# Patient Record
Sex: Female | Born: 1990 | Race: White | Hispanic: Yes | Marital: Married | Smoking: Never smoker
Health system: Southern US, Community
[De-identification: ages and names within clinical notes are randomized; demographics above are authoritative.]

## PROBLEM LIST (undated history)

## (undated) ENCOUNTER — Inpatient Hospital Stay (HOSPITAL_COMMUNITY): Payer: Self-pay

## (undated) DIAGNOSIS — R51 Headache: Secondary | ICD-10-CM

## (undated) DIAGNOSIS — Z8751 Personal history of pre-term labor: Secondary | ICD-10-CM

## (undated) DIAGNOSIS — R519 Headache, unspecified: Secondary | ICD-10-CM

## (undated) DIAGNOSIS — E282 Polycystic ovarian syndrome: Secondary | ICD-10-CM

## (undated) HISTORY — PX: BACK SURGERY: SHX140

## (undated) HISTORY — DX: Headache, unspecified: R51.9

## (undated) HISTORY — PX: OTHER SURGICAL HISTORY: SHX169

## (undated) HISTORY — DX: Polycystic ovarian syndrome: E28.2

## (undated) HISTORY — DX: Headache: R51

## (undated) HISTORY — PX: WISDOM TOOTH EXTRACTION: SHX21

---
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
## 2011-08-05 ENCOUNTER — Emergency Department (HOSPITAL_COMMUNITY)
Admission: EM | Admit: 2011-08-05 | Discharge: 2011-08-05 | Disposition: A | Discharge status: Home / Self Care | Payer: Self-pay | Attending: Emergency Medicine | Admitting: Emergency Medicine

## 2011-08-05 ENCOUNTER — Encounter (HOSPITAL_COMMUNITY): Payer: Self-pay | Admitting: Emergency Medicine

## 2011-08-05 ENCOUNTER — Emergency Department (HOSPITAL_COMMUNITY): Payer: Self-pay

## 2011-08-05 DIAGNOSIS — R110 Nausea: Secondary | ICD-10-CM | POA: Insufficient documentation

## 2011-08-05 DIAGNOSIS — R109 Unspecified abdominal pain: Secondary | ICD-10-CM | POA: Insufficient documentation

## 2011-08-05 DIAGNOSIS — R10819 Abdominal tenderness, unspecified site: Secondary | ICD-10-CM | POA: Insufficient documentation

## 2011-08-05 LAB — CBC
MCH: 28.6 pg (ref 26.0–34.0)
MCHC: 33 g/dL (ref 30.0–36.0)
MCV: 86.7 fL (ref 78.0–100.0)
Platelets: 232 10*3/uL (ref 150–400)

## 2011-08-05 LAB — COMPREHENSIVE METABOLIC PANEL
Albumin: 4.2 g/dL (ref 3.5–5.2)
Alkaline Phosphatase: 118 U/L — ABNORMAL HIGH (ref 39–117)
BUN: 12 mg/dL (ref 6–23)
Calcium: 9.3 mg/dL (ref 8.4–10.5)
GFR calc Af Amer: >90 mL/min (ref >90)
Glucose, Bld: 96 mg/dL (ref 70–99)
Potassium: 4 mEq/L (ref 3.5–5.1)
Sodium: 139 mEq/L (ref 135–145)
Total Protein: 8.4 g/dL — ABNORMAL HIGH (ref 6.0–8.3)

## 2011-08-05 LAB — URINALYSIS, ROUTINE W REFLEX MICROSCOPIC
Glucose, UA: NEGATIVE mg/dL
Ketones, ur: NEGATIVE mg/dL
Nitrite: NEGATIVE
Specific Gravity, Urine: 1.024 (ref 1.005–1.030)
pH: 5.5 (ref 5.0–8.0)

## 2011-08-05 LAB — POCT I-STAT, CHEM 8
Chloride: 105 mEq/L (ref 96–112)
HCT: 43 % (ref 36.0–46.0)
Hemoglobin: 14.6 g/dL (ref 12.0–15.0)
Potassium: 4 mEq/L (ref 3.5–5.1)
Sodium: 141 mEq/L (ref 135–145)

## 2011-08-05 LAB — URINE MICROSCOPIC-ADD ON

## 2011-08-05 LAB — DIFFERENTIAL
Basophils Relative: 0 % (ref 0–1)
Eosinophils Absolute: 0.1 10*3/uL (ref 0.0–0.7)
Eosinophils Relative: 1 % (ref 0–5)
Monocytes Relative: 6 % (ref 3–12)
Neutrophils Relative %: 77 % (ref 43–77)

## 2011-08-05 LAB — PREGNANCY, URINE: Preg Test, Ur: NEGATIVE

## 2011-08-05 MED ORDER — PANTOPRAZOLE SODIUM 20 MG PO TBEC: 20.0000 mg | DELAYED_RELEASE_TABLET | Freq: Every day | ORAL | Status: DC

## 2011-08-05 MED ORDER — IBUPROFEN 400 MG PO TABS
600.0000 mg | ORAL_TABLET | Freq: Once | ORAL | Status: AC
  Administered 2011-08-05: 600 mg via ORAL
  Filled 2011-08-05: qty 3

## 2011-08-05 MED ORDER — ONDANSETRON HCL 4 MG PO TABS: 4.0000 mg | ORAL_TABLET | Freq: Four times a day (QID) | ORAL | Status: AC

## 2011-08-05 NOTE — ED Notes (Signed)
Pt. Stated, I have  A sharp pain in my stomach this am, no other symptoms

## 2011-08-05 NOTE — ED Provider Notes (Signed)
History     CSN: [id number]  Arrival date & time 08/05/11  1150   First MD Initiated Contact with Patient 08/05/11 1435      Chief Complaint  Patient presents with  . Abdominal Pain    (Consider location/radiation/quality/duration/timing/severity/associated sxs/prior treatment) Patient is a 21 y.o. female presenting with abdominal pain. The history is provided by the patient.  Abdominal Pain The primary symptoms of the illness include abdominal pain and nausea. The primary symptoms of the illness do not include shortness of breath, vomiting or diarrhea.  Symptoms associated with the illness do not include back pain.   patient has had upper abdominal pain since earlier today. She's not eaten. No fevers. She's had some nausea without vomiting. No diarrhea. Constipation. No dysuria. The pain is constant. She states she's not had pains like this before. She states she has previously lower abdominal pains, but she had an ultrasound for and was told it was nothing. She did not eat breakfast today.  History reviewed. No pertinent past medical history.  History reviewed. No pertinent past surgical history.  No family history on file.  History  Substance Use Topics  . Smoking status: Not on file  . Smokeless tobacco: Not on file  . Alcohol Use: No    OB History    Grav Para Term Preterm Abortions TAB SAB Ect Mult Living                  Review of Systems  Constitutional: Negative for activity change and appetite change.  HENT: Negative for neck stiffness.   Eyes: Negative for pain.  Respiratory: Negative for chest tightness and shortness of breath.   Cardiovascular: Negative for chest pain and leg swelling.  Gastrointestinal: Positive for nausea and abdominal pain. Negative for vomiting and diarrhea.  Genitourinary: Negative for flank pain.  Musculoskeletal: Negative for back pain.  Skin: Negative for rash.  Neurological: Negative for weakness, numbness and headaches.    Psychiatric/Behavioral: Negative for behavioral problems.    Allergies  Review of patient's allergies indicates no known allergies.  Home Medications   Current Outpatient Rx  Name Route Sig Dispense Refill  . ONDANSETRON HCL 4 MG PO TABS Oral Take 1 tablet (4 mg total) by mouth every 6 (six) hours. 12 tablet 0  . PANTOPRAZOLE SODIUM 20 MG PO TBEC Oral Take 1 tablet (20 mg total) by mouth daily. 14 tablet 0    BP 123/69  Pulse 99  Temp 100.5 F (38.1 C) (Oral)  Resp 18  SpO2 97%  Physical Exam  Nursing note and vitals reviewed. Constitutional: She is oriented to person, place, and time. She appears well-developed and well-nourished.  HENT:  Head: Normocephalic and atraumatic.  Neck: Normal range of motion. Neck supple.  Cardiovascular: Normal rate, regular rhythm and normal heart sounds.   No murmur heard. Pulmonary/Chest: Effort normal and breath sounds normal. No respiratory distress. She has no wheezes. She has no rales.  Abdominal: Soft. Bowel sounds are normal. She exhibits no distension. There is tenderness. There is no rebound and no guarding.       Patient is somewhat obese. Right upper quadrant to epigastric tenderness without rebound or guarding.  Genitourinary:       No CVA tenderness  Musculoskeletal: Normal range of motion.  Neurological: She is alert and oriented to person, place, and time. No cranial nerve deficit.  Skin: Skin is warm and dry.  Psychiatric: She has a normal mood and affect. Her speech is normal.  ED Course  Procedures (including critical care time)  Labs Reviewed  CBC - Abnormal; Notable for the following:    WBC 16.5 (*)     All other components within normal limits  DIFFERENTIAL - Abnormal; Notable for the following:    Neutro Abs 12.7 (*)     All other components within normal limits  COMPREHENSIVE METABOLIC PANEL - Abnormal; Notable for the following:    Total Protein 8.4 (*)     Alkaline Phosphatase 118 (*)     All other  components within normal limits  URINALYSIS, ROUTINE W REFLEX MICROSCOPIC - Abnormal; Notable for the following:    Leukocytes, UA SMALL (*)     All other components within normal limits  URINE MICROSCOPIC-ADD ON - Abnormal; Notable for the following:    Squamous Epithelial / LPF MANY (*)     All other components within normal limits  PREGNANCY, URINE  POCT I-STAT, CHEM 8   US Abdomen Complete  08/05/2011  *RADIOLOGY REPORT*  Clinical Data:  Right upper quadrant pain.  COMPLETE ABDOMINAL ULTRASOUND  Comparison:  None.  Findings:  Gallbladder:  No gallstones, gallbladder wall thickening, or pericholecystic fluid.  Common bile duct:  Measures 0.6 cm.  Liver:  The liver is heterogeneous and difficult to evaluate.  IVC:  Appears normal.  Pancreas:  Limited evaluation.  Spleen:  Measures 8.0 cm in length.  Right Kidney:  Measures 12.9 cm without hydronephrosis.  Left Kidney:  Measures 11.7 cm in length without hydronephrosis.  Abdominal aorta:  No aneurysm identified.  IMPRESSION: Negative abdominal ultrasound.  Normal appearance of the gallbladder.  Liver is heterogeneous and difficult to evaluate.  Original Report Authenticated By: Richarda Overlie, M.D.     1. Abdominal pain       MDM  Patient with upper abdominal pain. Some nauseousness no diarrhea. WBC is mildly elevated. That work is otherwise reassuring. Negative ultrasound. Overall rather benign examination. Lungs are clear. With the elevated white count and a fever that developed at the end of the stay be related to viral infection. I doubt an appendicitis or severe intra-abdominal infection. Patient be discharged home to followup as needed.        Juliet Rude. Rubin Payor, MD 08/05/11 [phone]

## 2011-12-12 ENCOUNTER — Encounter (HOSPITAL_COMMUNITY): Payer: Self-pay | Admitting: *Deleted

## 2011-12-12 ENCOUNTER — Emergency Department (HOSPITAL_COMMUNITY)
Admission: EM | Admit: 2011-12-12 | Discharge: 2011-12-12 | Disposition: A | Discharge status: Home / Self Care | Payer: Self-pay | Attending: Emergency Medicine | Admitting: Emergency Medicine

## 2011-12-12 DIAGNOSIS — H53149 Visual discomfort, unspecified: Secondary | ICD-10-CM | POA: Insufficient documentation

## 2011-12-12 DIAGNOSIS — G43909 Migraine, unspecified, not intractable, without status migrainosus: Secondary | ICD-10-CM | POA: Insufficient documentation

## 2011-12-12 DIAGNOSIS — R112 Nausea with vomiting, unspecified: Secondary | ICD-10-CM | POA: Insufficient documentation

## 2011-12-12 MED ORDER — METOCLOPRAMIDE HCL 5 MG/ML IJ SOLN
10.0000 mg | Freq: Once | INTRAMUSCULAR | Status: AC
  Administered 2011-12-12: 10 mg via INTRAVENOUS
  Filled 2011-12-12: qty 2

## 2011-12-12 MED ORDER — SODIUM CHLORIDE 0.9 % IV BOLUS (SEPSIS)
1000.0000 mL | Freq: Once | INTRAVENOUS | Status: AC
  Administered 2011-12-12: 1000 mL via INTRAVENOUS

## 2011-12-12 MED ORDER — DIPHENHYDRAMINE HCL 50 MG/ML IJ SOLN
25.0000 mg | Freq: Once | INTRAMUSCULAR | Status: AC
  Administered 2011-12-12: 25 mg via INTRAVENOUS
  Filled 2011-12-12: qty 1

## 2011-12-12 MED ORDER — DEXAMETHASONE SODIUM PHOSPHATE 10 MG/ML IJ SOLN
10.0000 mg | Freq: Once | INTRAMUSCULAR | Status: AC
  Administered 2011-12-12: 10 mg via INTRAVENOUS
  Filled 2011-12-12: qty 1

## 2011-12-12 NOTE — ED Provider Notes (Signed)
History     CSN: [id number]  Arrival date & time 12/12/11  2203   First MD Initiated Contact with Patient 12/12/11 2214      Chief Complaint  Patient presents with  . Headache    (Consider location/radiation/quality/duration/timing/severity/associated sxs/prior treatment) HPI Comments: Patient with a history of Migraines presents today with a chief complaint of headache.  She reports that her headache is similar to Migraines that she has had in the past.   Headache gradual in onset.  She denies any head injury.  She denies fever or chills.  Denies visual changes.  Denies neck pain or stiffness.    Patient is a 21 y.o. female presenting with headaches. The history is provided by the patient.  Headache  This is a new problem. The current episode started 6 to 12 hours ago. Episode frequency: intermittently. The problem has been gradually worsening. The pain is located in the frontal region. The quality of the pain is described as throbbing. The pain does not radiate. Associated symptoms include nausea and vomiting. Pertinent negatives include no fever, no near-syncope and no syncope. She has tried acetaminophen and NSAIDs for the symptoms. The treatment provided no relief.    History reviewed. No pertinent past medical history.  History reviewed. No pertinent past surgical history.  No family history on file.  History  Substance Use Topics  . Smoking status: Never Smoker   . Smokeless tobacco: Not on file  . Alcohol Use: No    OB History    Grav Para Term Preterm Abortions TAB SAB Ect Mult Living                  Review of Systems  Constitutional: Negative for fever and chills.  HENT: Negative for neck pain and neck stiffness.   Eyes: Positive for photophobia. Negative for pain, redness and visual disturbance.  Cardiovascular: Negative for syncope and near-syncope.  Gastrointestinal: Positive for nausea and vomiting. Negative for abdominal pain.  Skin: Negative for rash.    Neurological: Positive for headaches. Negative for dizziness, syncope, speech difficulty, weakness and light-headedness.  Psychiatric/Behavioral: Negative for confusion.  All other systems reviewed and are negative.    Allergies  Review of patient's allergies indicates no known allergies.  Home Medications  No current outpatient prescriptions on file.  BP 125/73  Pulse 104  Temp 97.7 F (36.5 C) (Oral)  Resp 22  SpO2 98%  Physical Exam  Nursing note and vitals reviewed. Constitutional: She appears well-developed and well-nourished. No distress.  HENT:  Head: Normocephalic and atraumatic.  Eyes: EOM are normal. Pupils are equal, round, and reactive to light.  Neck: Normal range of motion. Neck supple.  Cardiovascular: Normal rate, regular rhythm and normal heart sounds.   Pulmonary/Chest: Effort normal and breath sounds normal.  Abdominal: Soft. Bowel sounds are normal.  Musculoskeletal: Normal range of motion.  Neurological: She is alert. She has normal strength. No cranial nerve deficit or sensory deficit. Coordination and gait normal.       Normal rapid alternating movements Normal finger to nose testing Normal gait, no ataxia  Skin: Skin is warm and dry. No rash noted. She is not diaphoretic.  Psychiatric: She has a normal mood and affect.    ED Course  Procedures (including critical care time)  Labs Reviewed - No data to display No results found.   No diagnosis found.  11:12 PM Reassessed patient.  Headache has resolved at this time.  MDM  Pt HA treated and improved  while in ED.  Presentation is like pts typical HA and non concerning for [facility], ICH, or Meningitis. Pt is afebrile with no focal neuro deficits, nuchal rigidity, or change in vision. Pt is to follow up with PCP to discuss prophylactic medication. Pt verbalizes understanding and is agreeable with plan to dc.         Pascal Lux Hawley, PA-C 12/13/11 1131

## 2011-12-12 NOTE — ED Notes (Signed)
Headache since this am.  Her headache slowed then increased in intensity when she woke up from a nap.  Crying at present

## 2011-12-13 NOTE — ED Provider Notes (Signed)
Medical screening examination/treatment/procedure(s) were performed by non-physician practitioner and as supervising physician I was immediately available for consultation/collaboration.   Glynn Octave, MD 12/13/11 1155

## 2013-02-19 ENCOUNTER — Encounter: Payer: Self-pay | Admitting: Obstetrics

## 2013-03-03 ENCOUNTER — Ambulatory Visit (INDEPENDENT_AMBULATORY_CARE_PROVIDER_SITE_OTHER): Payer: Medicaid Other | Admitting: Obstetrics

## 2013-03-03 ENCOUNTER — Encounter: Payer: Self-pay | Admitting: Obstetrics

## 2013-03-03 VITALS — BP 135/89 | HR 89 | Temp 97.0°F | Ht 64.0 in | Wt 281.0 lb

## 2013-03-03 DIAGNOSIS — Z3169 Encounter for other general counseling and advice on procreation: Secondary | ICD-10-CM

## 2013-03-03 DIAGNOSIS — Z3009 Encounter for other general counseling and advice on contraception: Secondary | ICD-10-CM

## 2013-03-03 DIAGNOSIS — N926 Irregular menstruation, unspecified: Secondary | ICD-10-CM | POA: Insufficient documentation

## 2013-03-03 DIAGNOSIS — N949 Unspecified condition associated with female genital organs and menstrual cycle: Secondary | ICD-10-CM | POA: Insufficient documentation

## 2013-03-03 DIAGNOSIS — N760 Acute vaginitis: Secondary | ICD-10-CM | POA: Insufficient documentation

## 2013-03-03 DIAGNOSIS — E282 Polycystic ovarian syndrome: Secondary | ICD-10-CM | POA: Insufficient documentation

## 2013-03-03 DIAGNOSIS — N912 Amenorrhea, unspecified: Secondary | ICD-10-CM | POA: Insufficient documentation

## 2013-03-03 HISTORY — DX: Irregular menstruation, unspecified: N92.6

## 2013-03-03 LAB — COMPREHENSIVE METABOLIC PANEL
ALT: 30 U/L (ref 0–35)
AST: 21 U/L (ref 0–37)
Albumin: 4.5 g/dL (ref 3.5–5.2)
Alkaline Phosphatase: 112 U/L (ref 39–117)
BILIRUBIN TOTAL: 0.3 mg/dL (ref 0.2–1.2)
BUN: 11 mg/dL (ref 6–23)
CO2: 29 meq/L (ref 19–32)
CREATININE: 0.78 mg/dL (ref 0.50–1.10)
Calcium: 10 mg/dL (ref 8.4–10.5)
Chloride: 101 mEq/L (ref 96–112)
Glucose, Bld: 92 mg/dL (ref 70–99)
Potassium: 4 mEq/L (ref 3.5–5.3)
Sodium: 137 mEq/L (ref 135–145)
Total Protein: 7.6 g/dL (ref 6.0–8.3)

## 2013-03-03 LAB — CBC WITH DIFFERENTIAL/PLATELET
Basophils Absolute: 0 10*3/uL (ref 0.0–0.1)
Basophils Relative: 0 % (ref 0–1)
EOS ABS: 0.1 10*3/uL (ref 0.0–0.7)
EOS PCT: 1 % (ref 0–5)
HCT: 41.2 % (ref 36.0–46.0)
Hemoglobin: 13.6 g/dL (ref 12.0–15.0)
LYMPHS ABS: 4.1 10*3/uL — AB (ref 0.7–4.0)
Lymphocytes Relative: 40 % (ref 12–46)
MCH: 29.1 pg (ref 26.0–34.0)
MCHC: 33 g/dL (ref 30.0–36.0)
MCV: 88 fL (ref 78.0–100.0)
MONO ABS: 0.7 10*3/uL (ref 0.1–1.0)
Monocytes Relative: 7 % (ref 3–12)
Neutro Abs: 5.3 10*3/uL (ref 1.7–7.7)
Neutrophils Relative %: 52 % (ref 43–77)
PLATELETS: 270 10*3/uL (ref 150–400)
RBC: 4.68 MIL/uL (ref 3.87–5.11)
RDW: 14.4 % (ref 11.5–15.5)
WBC: 10.2 10*3/uL (ref 4.0–10.5)

## 2013-03-03 LAB — POCT URINE PREGNANCY: PREG TEST UR: NEGATIVE

## 2013-03-03 LAB — HEMOGLOBIN A1C
HEMOGLOBIN A1C: 5.6 % (ref <5.7)
MEAN PLASMA GLUCOSE: 114 mg/dL (ref <117)

## 2013-03-03 MED ORDER — LEVONORGESTREL-ETHINYL ESTRAD 0.15-30 MG-MCG PO TABS
1.0000 | ORAL_TABLET | Freq: Every day | ORAL | Status: DC
Start: 2013-03-03 — End: 2014-02-03

## 2013-03-03 MED ORDER — PNV PRENATAL PLUS MULTIVITAMIN 27-1 MG PO TABS: 1.0000 | ORAL_TABLET | Freq: Every day | ORAL | Status: DC

## 2013-03-03 MED ORDER — METFORMIN HCL ER (MOD) 500 MG PO TB24: 1500.0000 mg | ORAL_TABLET | Freq: Every day | ORAL | Status: DC

## 2013-03-03 NOTE — Progress Notes (Signed)
Subjective:     Alexandra Raymond is a 23 y.o. female here for a routine exam.  Current complaints:Patient states she has a history of PCOS. Patient states she is having lower abdomen pain. Patient states the pain started increasing within the last month. Patient states it is a sharp shooting pain.  Personal health questionnaire reviewed: yes.   Gynecologic History Patient's last menstrual period was 10/11/2012. Contraception: condoms Last Pap: 09/2012. Results were: normal  Obstetric History OB History  Gravida Para Term Preterm AB SAB TAB Ectopic Multiple Living  1 1  1      1     # Outcome Date GA Lbr Len/2nd Weight Sex Delivery Anes PTL Lv  1 PRE 10/09/08 [id number]  3 lb 1 oz (1.389 kg) M SVD None Y Y       The following portions of the patient's history were reviewed and updated as appropriate: allergies, current medications, past family history, past medical history, past social history, past surgical history and problem list.  Review of Systems Pertinent items are noted in HPI.    Objective:    General appearance: alert and no distress Abdomen: normal findings: soft, non-tender Pelvic: cervix normal in appearance, external genitalia normal, no adnexal masses or tenderness, no cervical motion tenderness, rectovaginal septum normal, uterus normal size, shape, and consistency and vagina normal without discharge    Assessment:    Healthy female exam.   Irregular menstrual cycles.  Obesity.  ? H/O PCOS.  Desires future fertility.   Plan:    Education reviewed: Management of PCOS.. Contraception: OCP (estrogen/progesterone). Follow up in: 4 months. Metformin ER Rx. Nordette 28 Rx. PNV's Rx.

## 2013-03-04 LAB — GC/CHLAMYDIA PROBE AMP
CT Probe RNA: NEGATIVE
GC PROBE AMP APTIMA: NEGATIVE

## 2013-03-04 LAB — WET PREP BY MOLECULAR PROBE
CANDIDA SPECIES: NEGATIVE
Gardnerella vaginalis: NEGATIVE
Trichomonas vaginosis: NEGATIVE

## 2013-03-04 LAB — TSH: TSH: 2.613 u[IU]/mL (ref 0.350–4.500)

## 2013-03-09 ENCOUNTER — Encounter: Payer: Self-pay | Admitting: Obstetrics

## 2013-03-16 ENCOUNTER — Other Ambulatory Visit: Payer: Self-pay | Admitting: Obstetrics

## 2013-03-16 DIAGNOSIS — E282 Polycystic ovarian syndrome: Secondary | ICD-10-CM

## 2013-03-17 ENCOUNTER — Ambulatory Visit (HOSPITAL_COMMUNITY)
Admission: RE | Admit: 2013-03-17 | Discharge: 2013-03-17 | Disposition: A | Discharge status: Home / Self Care | Payer: Medicaid Other | Source: Ambulatory Visit | Attending: Obstetrics | Admitting: Obstetrics

## 2013-03-17 DIAGNOSIS — E282 Polycystic ovarian syndrome: Secondary | ICD-10-CM

## 2013-03-17 DIAGNOSIS — N926 Irregular menstruation, unspecified: Secondary | ICD-10-CM | POA: Insufficient documentation

## 2013-03-18 ENCOUNTER — Other Ambulatory Visit: Payer: Self-pay | Admitting: *Deleted

## 2013-03-18 DIAGNOSIS — E282 Polycystic ovarian syndrome: Secondary | ICD-10-CM

## 2013-03-18 MED ORDER — METFORMIN HCL ER 500 MG PO TB24: 1500.0000 mg | ORAL_TABLET | Freq: Once | ORAL | Status: DC

## 2013-03-18 MED ORDER — METFORMIN HCL ER (MOD) 500 MG PO TB24: 1500.0000 mg | ORAL_TABLET | Freq: Every day | ORAL | Status: DC

## 2013-03-19 ENCOUNTER — Encounter: Payer: Self-pay | Admitting: Obstetrics

## 2013-07-01 ENCOUNTER — Ambulatory Visit (INDEPENDENT_AMBULATORY_CARE_PROVIDER_SITE_OTHER): Payer: Medicaid Other | Admitting: Obstetrics

## 2013-07-01 ENCOUNTER — Encounter: Payer: Self-pay | Admitting: Obstetrics

## 2013-07-01 VITALS — BP 140/85 | HR 99 | Temp 100.0°F | Ht 64.0 in | Wt 272.0 lb

## 2013-07-01 DIAGNOSIS — N926 Irregular menstruation, unspecified: Secondary | ICD-10-CM

## 2013-07-01 NOTE — Progress Notes (Signed)
Patient ID: Alexandra Raymond, female   DOB: 1990/03/22, 23 y.o.   MRN: [id number]  Chief Complaint  Patient presents with  . Follow-up    HPI Alexandra Meringdriana Krouse is a 23 y.o. female.  H/O irregular cycles.  Started on Nordette 28 on last visit for cycle regulation.  Returns today for F/U.  Cycles now regular.  HPI  Past Medical History  Diagnosis Date  . PCOS (polycystic ovarian syndrome)     Past Surgical History  Procedure Laterality Date  . Cystectomy      Family History  Problem Relation Age of Onset  . Diabetes Mother     Social History History  Substance Use Topics  . Smoking status: Never Smoker   . Smokeless tobacco: Not on file  . Alcohol Use: No     Comment: Occassionally    No Known Allergies  Current Outpatient Prescriptions  Medication Sig Dispense Refill  . levonorgestrel-ethinyl estradiol (NORDETTE) 0.15-30 MG-MCG tablet Take 1 tablet by mouth daily.  1 Package  11  . metFORMIN (GLUMETZA) 500 MG (MOD) 24 hr tablet Take 3 tablets (1,500 mg total) by mouth daily with supper.  90 tablet  11  . Omega-3 Fatty Acids (FISH OIL) 1000 MG CAPS Take by mouth.      . Prenatal Vit-Fe Fumarate-FA (PNV PRENATAL PLUS MULTIVITAMIN) 27-1 MG TABS Take 1 tablet by mouth daily before breakfast.  30 tablet  11   No current facility-administered medications for this visit.    Review of Systems Review of Systems Constitutional: negative for fatigue and weight loss Respiratory: negative for cough and wheezing Cardiovascular: negative for chest pain, fatigue and palpitations Gastrointestinal: negative for abdominal pain and change in bowel habits Genitourinary:negative Integument/breast: negative for nipple discharge Musculoskeletal:negative for myalgias Neurological: negative for gait problems and tremors Behavioral/Psych: negative for abusive relationship, depression Endocrine: negative for temperature intolerance     Blood pressure 140/85, pulse 99, temperature 100 F  (37.8 C), height 5\' 4"  (1.626 m), weight 272 lb (123.378 kg), last menstrual period 06/29/2013.  Physical Exam Physical Exam General:   alert  Skin:   no rash or abnormalities  Lungs:   clear to auscultation bilaterally  Heart:   regular rate and rhythm, S1, S2 normal, no murmur, click, rub or gallop  Breasts:   normal without suspicious masses, skin or nipple changes or axillary nodes  Abdomen:  normal findings: no organomegaly, soft, non-tender and no hernia  Pelvis:  External genitalia: normal general appearance Urinary system: urethral meatus normal and bladder without fullness, nontender Vaginal: normal without tenderness, induration or masses Cervix: normal appearance Adnexa: normal bimanual exam Uterus: anteverted and non-tender, normal size    100% of 10 min visit spent on counseling and coordination of care.   Data Reviewed Labs  Assessment    Irregular menstrual cycles.  Resolved with OCP's.  Wants pregnancy later this year.    Plan    Continue OCP's. Preconception counseling done.  No orders of the defined types were placed in this encounter.   No orders of the defined types were placed in this encounter.        HARPER,CHARLES A 07/01/2013, 12:38 PM

## 2013-10-22 ENCOUNTER — Telehealth: Payer: Self-pay | Admitting: *Deleted

## 2013-10-22 NOTE — Telephone Encounter (Signed)
Patient states she has a question about medication. Attempted to contact patient and left message for patient to contact the office.

## 2013-10-25 NOTE — Telephone Encounter (Signed)
Patient states she stopped her birth control pills. She wants to know if she should continue her metformin. 10/12 1:21 Spoke to patient- will ask her provider and let her know.

## 2013-10-30 NOTE — Telephone Encounter (Signed)
Dr. Verdell CarmineHarper's patient

## 2013-11-15 ENCOUNTER — Encounter: Payer: Self-pay | Admitting: Obstetrics

## 2013-11-16 NOTE — Telephone Encounter (Signed)
Will forward to Dr Clearance CootsHarper for review

## 2013-11-17 NOTE — Telephone Encounter (Signed)
Continue Metformin 

## 2013-11-25 NOTE — Telephone Encounter (Signed)
Patient advised to continue metformin

## 2014-01-03 ENCOUNTER — Ambulatory Visit: Payer: Medicaid Other | Admitting: Obstetrics

## 2014-01-13 ENCOUNTER — Encounter (HOSPITAL_COMMUNITY): Payer: Self-pay | Admitting: Emergency Medicine

## 2014-01-13 ENCOUNTER — Emergency Department (HOSPITAL_COMMUNITY)
Admission: EM | Admit: 2014-01-13 | Discharge: 2014-01-14 | Disposition: A | Discharge status: Home / Self Care | Payer: Medicaid Other | Attending: Emergency Medicine | Admitting: Emergency Medicine

## 2014-01-13 DIAGNOSIS — E282 Polycystic ovarian syndrome: Secondary | ICD-10-CM | POA: Insufficient documentation

## 2014-01-13 DIAGNOSIS — R51 Headache: Secondary | ICD-10-CM | POA: Diagnosis not present

## 2014-01-13 DIAGNOSIS — R112 Nausea with vomiting, unspecified: Secondary | ICD-10-CM | POA: Insufficient documentation

## 2014-01-13 DIAGNOSIS — Z793 Long term (current) use of hormonal contraceptives: Secondary | ICD-10-CM | POA: Diagnosis not present

## 2014-01-13 DIAGNOSIS — R519 Headache, unspecified: Secondary | ICD-10-CM

## 2014-01-13 DIAGNOSIS — Z79899 Other long term (current) drug therapy: Secondary | ICD-10-CM | POA: Diagnosis not present

## 2014-01-13 MED ORDER — METOCLOPRAMIDE HCL 5 MG/ML IJ SOLN
10.0000 mg | Freq: Once | INTRAMUSCULAR | Status: AC
  Administered 2014-01-13: 10 mg via INTRAVENOUS
  Filled 2014-01-13: qty 2

## 2014-01-13 MED ORDER — DIPHENHYDRAMINE HCL 50 MG/ML IJ SOLN
25.0000 mg | Freq: Once | INTRAMUSCULAR | Status: AC
  Administered 2014-01-13: 25 mg via INTRAVENOUS
  Filled 2014-01-13: qty 1

## 2014-01-13 NOTE — Discharge Instructions (Signed)
Please follow up with your primary care physician in 1-2 days. If you do not have one please call the  and wellness Center number listed above. Please read all discharge instructions and return precautions.  ° °General Headache Without Cause °A headache is pain or discomfort felt around the head or neck area. The specific cause of a headache may not be found. There are many causes and types of headaches. A few common ones are: °· Tension headaches. °· Migraine headaches. °· Cluster headaches. °· Chronic daily headaches. °HOME CARE INSTRUCTIONS  °· Keep all follow-up appointments with your caregiver or any specialist referral. °· Only take over-the-counter or prescription medicines for pain or discomfort as directed by your caregiver. °· Lie down in a dark, quiet room when you have a headache. °· Keep a headache journal to find out what may trigger your migraine headaches. For example, write down: °¨ What you eat and drink. °¨ How much sleep you get. °¨ Any change to your diet or medicines. °· Try massage or other relaxation techniques. °· Put ice packs or heat on the head and neck. Use these 3 to 4 times per day for 15 to 20 minutes each time, or as needed. °· Limit stress. °· Sit up straight, and do not tense your muscles. °· Quit smoking if you smoke. °· Limit alcohol use. °· Decrease the amount of caffeine you drink, or stop drinking caffeine. °· Eat and sleep on a regular schedule. °· Get 7 to 9 hours of sleep, or as recommended by your caregiver. °· Keep lights dim if bright lights bother you and make your headaches worse. °SEEK MEDICAL CARE IF:  °· You have problems with the medicines you were prescribed. °· Your medicines are not working. °· You have a change from the usual headache. °· You have nausea or vomiting. °SEEK IMMEDIATE MEDICAL CARE IF:  °· Your headache becomes severe. °· You have a fever. °· You have a stiff neck. °· You have loss of vision. °· You have muscular weakness or loss of  muscle control. °· You start losing your balance or have trouble walking. °· You feel faint or pass out. °· You have severe symptoms that are different from your first symptoms. °MAKE SURE YOU:  °· Understand these instructions. °· Will watch your condition. °· Will get help right away if you are not doing well or get worse. °Document Released: 12/31/2004 Document Revised: 03/25/2011 Document Reviewed: 01/16/2011 °[employer]® Patient Information ©2015 [employer]. This information is not intended to replace advice given to you by your health care provider. Make sure you discuss any questions you have with your health care provider. ° °

## 2014-01-13 NOTE — ED Notes (Signed)
Pt. reports migraine headache onset today with nausea and vomitting , denies fever or blurred vision .

## 2014-01-13 NOTE — ED Provider Notes (Signed)
CSN: [id number]     Arrival date & time 01/13/14  2242 History   First MD Initiated Contact with Patient 01/13/14 2255     Chief Complaint  Patient presents with  . Migraine     (Consider location/radiation/quality/duration/timing/severity/associated sxs/prior Treatment) HPI Comments: Patient is a 23 yo F PMHx significant for PCOS, HAs presenting to the ED for a migraine that began today. She states it is a posterior throbbing headache. She tried Excedrin with no improvement. Endorses associated nausea, vomiting, sound sensitivity. Endorses history of similar headaches in the past. Denies fevers, chills, visual disturbances.    Past Medical History  Diagnosis Date  . PCOS (polycystic ovarian syndrome)    Past Surgical History  Procedure Laterality Date  . Cystectomy    . Back surgery     Family History  Problem Relation Age of Onset  . Diabetes Mother    History  Substance Use Topics  . Smoking status: Never Smoker   . Smokeless tobacco: Not on file  . Alcohol Use: No     Comment: Occassionally   OB History    Gravida Para Term Preterm AB TAB SAB Ectopic Multiple Living   1 1  1      1      Review of Systems  Gastrointestinal: Positive for nausea and vomiting.  Neurological: Positive for headaches.  All other systems reviewed and are negative.     Allergies  Review of patient's allergies indicates no known allergies.  Home Medications   Prior to Admission medications   Medication Sig Start Date End Date Taking? Authorizing Provider  levonorgestrel-ethinyl estradiol (NORDETTE) 0.15-30 MG-MCG tablet Take 1 tablet by mouth daily. 03/03/13   Brock Badharles A Harper, MD  metFORMIN (GLUMETZA) 500 MG (MOD) 24 hr tablet Take 3 tablets (1,500 mg total) by mouth daily with supper. 03/18/13   Brock Badharles A Harper, MD  Omega-3 Fatty Acids (FISH OIL) 1000 MG CAPS Take by mouth.    Historical Provider, MD  Prenatal Vit-Fe Fumarate-FA (PNV PRENATAL PLUS MULTIVITAMIN) 27-1 MG TABS Take 1  tablet by mouth daily before breakfast. 03/03/13   Brock Badharles A Harper, MD   BP 113/69 mmHg  Pulse 97  Temp(Src) 97.2 F (36.2 C) (Oral)  Resp 18  Ht 5\' 4"  (1.626 m)  Wt 260 lb (117.935 kg)  BMI 44.61 kg/m2  SpO2 98%  LMP  Physical Exam  Constitutional: She is oriented to person, place, and time. She appears well-developed and well-nourished. No distress.  HENT:  Head: Normocephalic and atraumatic.  Right Ear: External ear normal.  Left Ear: External ear normal.  Nose: Nose normal.  Mouth/Throat: Oropharynx is clear and moist. No oropharyngeal exudate.  Eyes: Conjunctivae and EOM are normal. Pupils are equal, round, and reactive to light.  Neck: Normal range of motion. Neck supple.  Cardiovascular: Normal rate, regular rhythm, normal heart sounds and intact distal pulses.   Pulmonary/Chest: Effort normal and breath sounds normal. No respiratory distress.  Abdominal: Soft. There is no tenderness.  Neurological: She is alert and oriented to person, place, and time. She has normal strength. No cranial nerve deficit. Gait normal. GCS eye subscore is 4. GCS verbal subscore is 5. GCS motor subscore is 6.  Sensation grossly intact.  No pronator drift.  Bilateral heel-knee-shin intact.  Skin: Skin is warm and dry. She is not diaphoretic.  Nursing note and vitals reviewed.   ED Course  Procedures (including critical care time) Medications  diphenhydrAMINE (BENADRYL) injection 25 mg (25 mg Intravenous Given  01/13/14 2332)  metoCLOPramide (REGLAN) injection 10 mg (10 mg Intravenous Given 01/13/14 2332)    Labs Review Labs Reviewed - No data to display  Imaging Review No results found.   EKG Interpretation None      MDM   Final diagnoses:  Bad headache    Filed Vitals:   01/14/14 0000  BP: 113/69  Pulse: 97  Temp:   Resp:    Afebrile, NAD, non-toxic appearing, AAOx4.  Pt HA treated and improved while in ED.  Presentation is like pts typical HA and non concerning for  [facility], [facility], Meningitis, or temporal arteritis. Pt is afebrile with no focal neuro deficits, nuchal rigidity, or change in vision. Pt is to follow up with PCP to discuss prophylactic medication. Pt verbalizes understanding and is agreeable with plan to dc.      Jeannetta EllisJennifer L Bell Carbo, PA-C 01/14/14 0013  Juliet RudeNathan R. Rubin PayorPickering, MD 01/14/14 [id number]

## 2014-01-14 NOTE — L&D Delivery Note (Signed)
Delivery Note At 9:52 AM a viable female was delivered via Vaginal, Spontaneous Delivery (Presentation: Right Occiput Anterior).  APGAR: 9, 9; weight 7 lb 4.9 oz (3315 g).   Placenta status: Intact, Spontaneous.  Cord: 3 vessels with the following complications: None.  Cord pH: none  Anesthesia: Local  Episiotomy: None Lacerations: 1st degree Suture Repair: 2.0 chromic Est. Blood Loss (mL): 150  Mom to postpartum.  Baby to Couplet care / Skin to Skin.  HARPER,CHARLES A 10/10/2014, 12:56 PM

## 2014-01-20 ENCOUNTER — Telehealth: Payer: Self-pay | Admitting: *Deleted

## 2014-01-20 NOTE — Telephone Encounter (Signed)
Patient states she is not feeling well and wants to come in- she does not want to wait until her annual exam. 11:44 Spoke with patient . Patient has a history of PCOS and stopped her OCP in September. She wants to get pregnant. Patient had a cycle in September, November and January. Patient states she has been having more frequent headaches and had one so bad she had to go to the ED. Patient states she has no appetite, migraines, and lower abdominal pain. Told patient she may need to be cycling more regularly and she may need more help with conception. Appointment to discuss care plan 01/21/2014.

## 2014-01-21 ENCOUNTER — Telehealth: Payer: Self-pay

## 2014-01-21 ENCOUNTER — Encounter: Payer: Self-pay | Admitting: Obstetrics

## 2014-01-21 ENCOUNTER — Ambulatory Visit (INDEPENDENT_AMBULATORY_CARE_PROVIDER_SITE_OTHER): Payer: Medicaid Other | Admitting: Obstetrics

## 2014-01-21 VITALS — BP 130/77 | HR 80 | Wt 255.0 lb

## 2014-01-21 DIAGNOSIS — G4459 Other complicated headache syndrome: Secondary | ICD-10-CM

## 2014-01-21 DIAGNOSIS — Z01419 Encounter for gynecological examination (general) (routine) without abnormal findings: Secondary | ICD-10-CM

## 2014-01-21 DIAGNOSIS — Z0000 Encounter for general adult medical examination without abnormal findings: Secondary | ICD-10-CM

## 2014-01-21 DIAGNOSIS — R102 Pelvic and perineal pain: Secondary | ICD-10-CM

## 2014-01-21 MED ORDER — BUTALBITAL-APAP-CAFFEINE 50-325-40 MG PO TABS: 2.0000 | ORAL_TABLET | Freq: Four times a day (QID) | ORAL | Status: DC | PRN

## 2014-01-21 MED ORDER — IBUPROFEN 800 MG PO TABS: 800.0000 mg | ORAL_TABLET | Freq: Three times a day (TID) | ORAL | Status: DC | PRN

## 2014-01-21 NOTE — Addendum Note (Signed)
Addended by: Marya LandryFOSTER, Mirl Hillery D on: 01/21/2014 03:14 PM   Modules accepted: Orders

## 2014-01-21 NOTE — Progress Notes (Signed)
Patient ID: Alexandra Raymond, female   DOB: 1990-04-15, 24 y.o.   MRN: [id number]  Chief Complaint  Patient presents with  . Pelvic Pain    HPI Alexandra Meringdriana Tangredi is a 24 y.o. female.  Pelvic pain x 2-3 months.  H/O PCOS.  On Metformin.  Was also on OCP's but stopped several months ago because she wants to get pregnant.  Would like PE and pap smear today. HPI  Past Medical History  Diagnosis Date  . PCOS (polycystic ovarian syndrome)     Past Surgical History  Procedure Laterality Date  . Cystectomy    . Back surgery      Family History  Problem Relation Age of Onset  . Diabetes Mother     Social History History  Substance Use Topics  . Smoking status: Never Smoker   . Smokeless tobacco: Not on file  . Alcohol Use: No     Comment: Occassionally    No Known Allergies  Current Outpatient Prescriptions  Medication Sig Dispense Refill  . metFORMIN (GLUMETZA) 500 MG (MOD) 24 hr tablet Take 3 tablets (1,500 mg total) by mouth daily with supper. 90 tablet 11  . Omega-3 Fatty Acids (FISH OIL) 1000 MG CAPS Take by mouth.    . Prenatal Vit-Fe Fumarate-FA (PNV PRENATAL PLUS MULTIVITAMIN) 27-1 MG TABS Take 1 tablet by mouth daily before breakfast. 30 tablet 11  . butalbital-acetaminophen-caffeine (FIORICET) 50-325-40 MG per tablet Take 2 tablets by mouth every 6 (six) hours as needed for headache or migraine. 40 tablet 2  . ibuprofen (ADVIL,MOTRIN) 800 MG tablet Take 1 tablet (800 mg total) by mouth every 8 (eight) hours as needed. 30 tablet 5  . levonorgestrel-ethinyl estradiol (NORDETTE) 0.15-30 MG-MCG tablet Take 1 tablet by mouth daily. (Patient not taking: Reported on 01/21/2014) 1 Package 11   No current facility-administered medications for this visit.    Review of Systems Review of Systems Constitutional: negative for fatigue and weight loss Respiratory: negative for cough and wheezing Cardiovascular: negative for chest pain, fatigue and palpitations Gastrointestinal:  negative for abdominal pain and change in bowel habits Genitourinary: RLQ pain Integument/breast: negative for nipple discharge Musculoskeletal:negative for myalgias Neurological: negative for gait problems and tremors.  Positive for HA's Behavioral/Psych: negative for abusive relationship, depression Endocrine: negative for temperature intolerance     Blood pressure 130/77, pulse 80, weight 255 lb (115.667 kg), last menstrual period 01/16/2014.  Physical Exam Physical Exam General:   alert  Skin:   no rash or abnormalities  Lungs:   clear to auscultation bilaterally  Heart:   regular rate and rhythm, S1, S2 normal, no murmur, click, rub or gallop  Breasts:   normal without suspicious masses, skin or nipple changes or axillary nodes  Abdomen:  normal findings: no organomegaly, soft, non-tender and no hernia  Pelvis:  External genitalia: normal general appearance Urinary system: urethral meatus normal and bladder without fullness, nontender Vaginal: normal without tenderness, induration or masses Cervix: normal appearance Adnexa: RLQ tenderness.  No masses. Uterus: anteverted and non-tender, normal size      Data Reviewed Labs  Assessment    Healthy female exam.   RLQ pain H/O PCOS Migraine HA's    Plan    Ultrasound ordered Ibuprofen Rx Fioricet Rx Referred to Neurology for HA's  Orders Placed This Encounter  Procedures  . US Pelvis Complete    Standing Status: Future     Number of Occurrences:      Standing Expiration Date: 03/22/2015    Order  Specific Question:  Reason for Exam (SYMPTOM  OR DIAGNOSIS REQUIRED)    Answer:  pelvic pain    Order Specific Question:  Preferred imaging location?    Answer:  [facility]  . US Transvaginal Non-OB    Standing Status: Future     Number of Occurrences:      Standing Expiration Date: 03/22/2015    Order Specific Question:  Reason for Exam (SYMPTOM  OR DIAGNOSIS REQUIRED)    Answer:  pelvic pain    Order Specific  Question:  Preferred imaging location?    Answer:  [facility]  . Ambulatory referral to Neurology    Referral Priority:  Routine    Referral Type:  Consultation    Referral Reason:  Specialty Services Required    Requested Specialty:  Neurology    Number of Visits Requested:  1   Meds ordered this encounter  Medications  . ibuprofen (ADVIL,MOTRIN) 800 MG tablet    Sig: Take 1 tablet (800 mg total) by mouth every 8 (eight) hours as needed.    Dispense:  30 tablet    Refill:  5  . butalbital-acetaminophen-caffeine (FIORICET) 50-325-40 MG per tablet    Sig: Take 2 tablets by mouth every 6 (six) hours as needed for headache or migraine.    Dispense:  40 tablet    Refill:  2       Winfrey Chillemi A 01/21/2014, 2:18 PM

## 2014-01-21 NOTE — Telephone Encounter (Signed)
[facility] - asked for patient to arrive by 1:45pm

## 2014-01-25 ENCOUNTER — Other Ambulatory Visit: Payer: Self-pay | Admitting: Obstetrics

## 2014-01-25 DIAGNOSIS — N760 Acute vaginitis: Principal | ICD-10-CM

## 2014-01-25 DIAGNOSIS — B9689 Other specified bacterial agents as the cause of diseases classified elsewhere: Secondary | ICD-10-CM

## 2014-01-25 LAB — PAP IG W/ RFLX HPV ASCU

## 2014-01-25 MED ORDER — METRONIDAZOLE 500 MG PO TABS: 500.0000 mg | ORAL_TABLET | Freq: Two times a day (BID) | ORAL | Status: DC

## 2014-01-26 ENCOUNTER — Other Ambulatory Visit: Payer: Self-pay | Admitting: Obstetrics

## 2014-01-26 LAB — SURESWAB, VAGINOSIS/VAGINITIS PLUS
Atopobium vaginae: 6.8 Log (cells/mL)
C. ALBICANS, DNA: NOT DETECTED
C. PARAPSILOSIS, DNA: NOT DETECTED
C. TRACHOMATIS RNA, TMA: NOT DETECTED
C. TROPICALIS, DNA: NOT DETECTED
C. glabrata, DNA: NOT DETECTED
GARDNERELLA VAGINALIS: 7.9 Log (cells/mL)
LACTOBACILLUS SPECIES: NOT DETECTED Log (cells/mL)
MEGASPHAERA SPECIES: 7.5 Log (cells/mL)
N. gonorrhoeae RNA, TMA: NOT DETECTED
T. vaginalis RNA, QL TMA: NOT DETECTED

## 2014-01-28 ENCOUNTER — Ambulatory Visit (HOSPITAL_COMMUNITY)
Admission: RE | Admit: 2014-01-28 | Discharge: 2014-01-28 | Disposition: A | Discharge status: Home / Self Care | Payer: Medicaid Other | Source: Ambulatory Visit | Attending: Obstetrics | Admitting: Obstetrics

## 2014-01-28 DIAGNOSIS — R102 Pelvic and perineal pain: Secondary | ICD-10-CM

## 2014-01-31 ENCOUNTER — Ambulatory Visit: Payer: Medicaid Other | Admitting: Obstetrics

## 2014-02-03 ENCOUNTER — Ambulatory Visit (INDEPENDENT_AMBULATORY_CARE_PROVIDER_SITE_OTHER): Payer: Medicaid Other | Admitting: Obstetrics

## 2014-02-03 ENCOUNTER — Encounter: Payer: Self-pay | Admitting: Obstetrics

## 2014-02-03 VITALS — BP 133/86 | HR 108 | Temp 98.7°F | Ht 64.0 in | Wt 256.2 lb

## 2014-02-03 DIAGNOSIS — R102 Pelvic and perineal pain: Secondary | ICD-10-CM

## 2014-02-03 DIAGNOSIS — Z3169 Encounter for other general counseling and advice on procreation: Secondary | ICD-10-CM

## 2014-02-03 NOTE — Progress Notes (Signed)
   Patient ID: Alexandra Raymond, female   DOB: 1990-11-01, 24 y.o.   MRN: [id number]  Chief Complaint  Patient presents with  . Follow-up    U/S Results.     HPI Alexandra Raymond is a 24 y.o. female.  Presents for ultrasound results.  H/O pelvic pain.  HPI  Past Medical History  Diagnosis Date  . PCOS (polycystic ovarian syndrome)     Past Surgical History  Procedure Laterality Date  . Cystectomy    . Back surgery    . Wisdom tooth extraction      Family History  Problem Relation Age of Onset  . Diabetes Mother     Social History History  Substance Use Topics  . Smoking status: Never Smoker   . Smokeless tobacco: Not on file  . Alcohol Use: No     Comment: Occassionally    No Known Allergies  Current Outpatient Prescriptions  Medication Sig Dispense Refill  . butalbital-acetaminophen-caffeine (FIORICET) 50-325-40 MG per tablet Take 2 tablets by mouth every 6 (six) hours as needed for headache or migraine. 40 tablet 2  . ibuprofen (ADVIL,MOTRIN) 800 MG tablet Take 1 tablet (800 mg total) by mouth every 8 (eight) hours as needed. 30 tablet 5  . metFORMIN (GLUMETZA) 500 MG (MOD) 24 hr tablet Take 3 tablets (1,500 mg total) by mouth daily with supper. 90 tablet 11  . Omega-3 Fatty Acids (FISH OIL) 1000 MG CAPS Take by mouth.    . Prenatal Vit-Fe Fumarate-FA (PNV PRENATAL PLUS MULTIVITAMIN) 27-1 MG TABS Take 1 tablet by mouth daily before breakfast. 30 tablet 11   No current facility-administered medications for this visit.    Review of Systems Review of Systems Constitutional: negative for fatigue and weight loss Respiratory: negative for cough and wheezing Cardiovascular: negative for chest pain, fatigue and palpitations Gastrointestinal: negative for abdominal pain and change in bowel habits Genitourinary: pelvic pain Integument/breast: negative for nipple discharge Musculoskeletal:negative for myalgias Neurological: negative for gait problems and  tremors Behavioral/Psych: negative for abusive relationship, depression Endocrine: negative for temperature intolerance     Blood pressure 133/86, pulse 108, temperature 98.7 F (37.1 C), height 5\' 4"  (1.626 m), weight 256 lb 3.2 oz (116.212 kg), last menstrual period 01/16/2014.  Physical Exam Physical Exam: Deferred   Data Reviewed Ultrasound  Assessment    Pelvic pain.  Stable. Ultrasound WNL's. Trying to get pregnant.    Plan  Preconception counseling done. F/U prn  No orders of the defined types were placed in this encounter.   No orders of the defined types were placed in this encounter.

## 2014-02-04 ENCOUNTER — Ambulatory Visit: Payer: Medicaid Other | Admitting: Obstetrics

## 2014-02-23 ENCOUNTER — Other Ambulatory Visit (INDEPENDENT_AMBULATORY_CARE_PROVIDER_SITE_OTHER): Payer: Medicaid Other

## 2014-02-23 VITALS — BP 128/82 | HR 88 | Temp 98.7°F | Ht 64.0 in | Wt 263.0 lb

## 2014-02-23 DIAGNOSIS — Z3200 Encounter for pregnancy test, result unknown: Secondary | ICD-10-CM

## 2014-02-23 LAB — POCT URINE PREGNANCY: Preg Test, Ur: POSITIVE

## 2014-02-23 NOTE — Progress Notes (Signed)
Patient in the office today for a pregnancy test. Patient states she has been trying to get pregnant and has had several positive home pregnancy test. Pregnancy test in office is positive. Patient scheduled for a NOB and encouraged patient to continue prenatal vitamins. Patient advised of no show policy and verbalized understanding.   BP 128/82 mmHg  Pulse 88  Temp(Src) 98.7 F (37.1 C)  Ht 5\' 4"  (1.626 m)  Wt 263 lb (119.296 kg)  BMI 45.12 kg/m2  LMP 01/14/2014

## 2014-03-02 ENCOUNTER — Ambulatory Visit (INDEPENDENT_AMBULATORY_CARE_PROVIDER_SITE_OTHER): Payer: Medicaid Other | Admitting: Neurology

## 2014-03-02 ENCOUNTER — Telehealth: Payer: Self-pay | Admitting: *Deleted

## 2014-03-02 ENCOUNTER — Encounter: Payer: Self-pay | Admitting: Neurology

## 2014-03-02 VITALS — BP 132/74 | HR 100 | Temp 97.8°F | Resp 18 | Ht 64.0 in | Wt 261.1 lb

## 2014-03-02 DIAGNOSIS — Z3490 Encounter for supervision of normal pregnancy, unspecified, unspecified trimester: Secondary | ICD-10-CM

## 2014-03-02 DIAGNOSIS — Z331 Pregnant state, incidental: Secondary | ICD-10-CM

## 2014-03-02 DIAGNOSIS — G43009 Migraine without aura, not intractable, without status migrainosus: Secondary | ICD-10-CM

## 2014-03-02 NOTE — Telephone Encounter (Signed)
Patient request call back. 11:15 Patient reports she had light pink spotting for 2 days but now it has begun to pick up and is turning darker. Cramping is intermittent. Reviewed miscarriage precautions and signs and symptoms of early pregnancy loss. Discussed reasons for bleeding in early pregnancy and encouraged patient to monitor and treat her symptoms. Patient will call with changes and go to [facility] if her bleeding gets heavy and concerns her. Patient voices understanding.

## 2014-03-02 NOTE — Progress Notes (Signed)
NEUROLOGY CONSULTATION NOTE  Alexandra Raymond MRN: [id number] DOB: 1990/09/08  Referring provider: Dr. Clearance Coots Primary care provider: Dr. Clearance Coots  Reason for consult:  Migraine  HISTORY OF PRESENT ILLNESS: Alexandra Raymond is a 24 year old right-handed female with PCOS and pregnant who presents for migraine.  Records and labs reviewed.  Onset:  35 to 24 years old Location:  Bifrontal/retro-orbital Quality:  pressure Intensity:  9-10/10 Aura:  no Prodrome:  no Associated symptoms:  Photophobia.  No nausea, phonophobia, or visual disturbance Duration:  All day (30 minutes with ASA) Frequency:  8 days per month (2-3 days per month severe) up until 6 weeks ago, when she found out she was pregnant. Triggers/exacerbating factors:  none Relieving factors:  Cold rag, rest Activity:  2 or 3 times a month she cannot function.  Past abortive therapy:  ASA (effective), Excedrin Migraine (effective), Tylenol, a triptan (effective) Past preventative therapy:  none  Current abortive therapy:  Fioricet, ibuprofen  Current preventative therapy:  none  Caffeine:  no Alcohol:  no Smoker:  no Diet:  Needs to improve Exercise:  no Depression/stress:  no Sleep hygiene:  good Family history of headache:  no  PAST MEDICAL HISTORY: Past Medical History  Diagnosis Date  . PCOS (polycystic ovarian syndrome)   . Headache     PAST SURGICAL HISTORY: Past Surgical History  Procedure Laterality Date  . Cystectomy    . Back surgery    . Wisdom tooth extraction      MEDICATIONS: Current Outpatient Prescriptions on File Prior to Visit  Medication Sig Dispense Refill  . butalbital-acetaminophen-caffeine (FIORICET) 50-325-40 MG per tablet Take 2 tablets by mouth every 6 (six) hours as needed for headache or migraine. 40 tablet 2  . ibuprofen (ADVIL,MOTRIN) 800 MG tablet Take 1 tablet (800 mg total) by mouth every 8 (eight) hours as needed. 30 tablet 5  . metFORMIN (GLUMETZA) 500 MG  (MOD) 24 hr tablet Take 3 tablets (1,500 mg total) by mouth daily with supper. 90 tablet 11  . Omega-3 Fatty Acids (FISH OIL) 1000 MG CAPS Take by mouth.    . Prenatal Vit-Fe Fumarate-FA (PNV PRENATAL PLUS MULTIVITAMIN) 27-1 MG TABS Take 1 tablet by mouth daily before breakfast. 30 tablet 11   No current facility-administered medications on file prior to visit.    ALLERGIES: No Known Allergies  FAMILY HISTORY: Family History  Problem Relation Age of Onset  . Diabetes Mother     SOCIAL HISTORY: History   Social History  . Marital Status: Single    Spouse Name: N/A  . Number of Children: N/A  . Years of Education: N/A   Occupational History  . Not on file.   Social History Main Topics  . Smoking status: Never Smoker   . Smokeless tobacco: Not on file  . Alcohol Use: No     Comment: Occassionally  . Drug Use: No  . Sexual Activity:    Partners: Male    Birth Control/ Protection: None   Other Topics Concern  . Not on file   Social History Narrative    REVIEW OF SYSTEMS: Constitutional: No fevers, chills, or sweats, no generalized fatigue, change in appetite Eyes: No visual changes, double vision, eye pain Ear, nose and throat: No hearing loss, ear pain, nasal congestion, sore throat Cardiovascular: No chest pain, palpitations Respiratory:  No shortness of breath at rest or with exertion, wheezes GastrointestinaI: No nausea, vomiting, diarrhea, abdominal pain, fecal incontinence Genitourinary:  No dysuria, urinary retention or  frequency Musculoskeletal:  No neck pain, back pain Integumentary: No rash, pruritus, skin lesions Neurological: as above Psychiatric: No depression, insomnia, anxiety Endocrine: No palpitations, fatigue, diaphoresis, mood swings, change in appetite, change in weight, increased thirst Hematologic/Lymphatic:  No anemia, purpura, petechiae. Allergic/Immunologic: no itchy/runny eyes, nasal congestion, recent allergic reactions,  rashes  PHYSICAL EXAM: Filed Vitals:   03/02/14 1352  BP: 132/74  Pulse: 100  Temp: 97.8 F (36.6 C)  Resp: 18   General: No acute distress Head:  Normocephalic/atraumatic Eyes:  fundi unremarkable, without vessel changes, exudates, hemorrhages or papilledema. Neck: supple, no paraspinal tenderness, full range of motion Back: No paraspinal tenderness Heart: regular rate and rhythm Lungs: Clear to auscultation bilaterally. Vascular: No carotid bruits. Neurological Exam: Mental status: alert and oriented to person, place, and time, recent and remote memory intact, fund of knowledge intact, attention and concentration intact, speech fluent and not dysarthric, language intact. Cranial nerves: CN I: not tested CN II: pupils equal, round and reactive to light, visual fields intact, fundi unremarkable, without vessel changes, exudates, hemorrhages or papilledema. CN III, IV, VI:  full range of motion, no nystagmus, no ptosis CN V: facial sensation intact CN VII: upper and lower face symmetric CN VIII: hearing intact CN IX, X: gag intact, uvula midline CN XI: sternocleidomastoid and trapezius muscles intact CN XII: tongue midline Bulk & Tone: normal, no fasciculations. Motor:  5/5 throughout Sensation:  Temperature and vibration intact Deep Tendon Reflexes:  2+ throughout, toes downgoing Finger to nose testing:  No dysmetria Heel to shin:  No dysmetria Gait:  Normal station and stride.  Able to turn and walk in tandem. Romberg negative.  IMPRESSION: Episodic migraine without aura, improved since pregnancy  PLAN: No management at this time.  If she gets a headache, would limit to Tylenol.  If headaches become frequent, we can consider magnesium citrate 400mg  daily.  She will follow up in 3 months.  We will get notes from prior neurologist.  Thank you for allowing me to take part in the care of this patient.  Shon MilletAdam Jaffe, DO  CC: Coral Ceoharles Harper, MD

## 2014-03-02 NOTE — Patient Instructions (Signed)
Hopefully, you won't have any headaches while pregnant.  If you get a headache, you may take tylenol.  If headaches become frequent, you can take supplemental magnesium citrate 400mg  daily.  I wouldn't take anything for now.  To make sure things are still going well, follow up in 3 months.

## 2014-03-09 ENCOUNTER — Inpatient Hospital Stay (HOSPITAL_COMMUNITY)
Admission: AD | Admit: 2014-03-09 | Discharge: 2014-03-09 | Disposition: A | Discharge status: Home / Self Care | Payer: Medicaid Other | Source: Ambulatory Visit | Attending: Obstetrics | Admitting: Obstetrics

## 2014-03-09 ENCOUNTER — Telehealth: Payer: Self-pay | Admitting: *Deleted

## 2014-03-09 ENCOUNTER — Encounter (HOSPITAL_COMMUNITY): Payer: Self-pay | Admitting: *Deleted

## 2014-03-09 DIAGNOSIS — O219 Vomiting of pregnancy, unspecified: Secondary | ICD-10-CM

## 2014-03-09 DIAGNOSIS — Z3A01 Less than 8 weeks gestation of pregnancy: Secondary | ICD-10-CM | POA: Diagnosis not present

## 2014-03-09 DIAGNOSIS — R51 Headache: Secondary | ICD-10-CM | POA: Diagnosis not present

## 2014-03-09 DIAGNOSIS — O210 Mild hyperemesis gravidarum: Secondary | ICD-10-CM | POA: Diagnosis present

## 2014-03-09 DIAGNOSIS — O211 Hyperemesis gravidarum with metabolic disturbance: Secondary | ICD-10-CM | POA: Insufficient documentation

## 2014-03-09 DIAGNOSIS — G44209 Tension-type headache, unspecified, not intractable: Secondary | ICD-10-CM

## 2014-03-09 LAB — URINALYSIS, ROUTINE W REFLEX MICROSCOPIC
GLUCOSE, UA: NEGATIVE mg/dL
HGB URINE DIPSTICK: NEGATIVE
Ketones, ur: 15 mg/dL — AB
Leukocytes, UA: NEGATIVE
Nitrite: NEGATIVE
Protein, ur: NEGATIVE mg/dL
SPECIFIC GRAVITY, URINE: 1.02 (ref 1.005–1.030)
Urobilinogen, UA: 1 mg/dL (ref 0.0–1.0)
pH: 6 (ref 5.0–8.0)

## 2014-03-09 MED ORDER — METOCLOPRAMIDE HCL 10 MG PO TABS: 10.0000 mg | ORAL_TABLET | Freq: Three times a day (TID) | ORAL | Status: DC

## 2014-03-09 MED ORDER — DEXTROSE 5 % IN LACTATED RINGERS IV BOLUS
1000.0000 mL | Freq: Once | INTRAVENOUS | Status: AC
  Administered 2014-03-09: 1000 mL via INTRAVENOUS

## 2014-03-09 MED ORDER — DEXAMETHASONE SODIUM PHOSPHATE 10 MG/ML IJ SOLN
10.0000 mg | Freq: Once | INTRAMUSCULAR | Status: AC
  Administered 2014-03-09: 10 mg via INTRAVENOUS
  Filled 2014-03-09: qty 1

## 2014-03-09 MED ORDER — DIPHENHYDRAMINE HCL 50 MG/ML IJ SOLN
12.5000 mg | Freq: Once | INTRAMUSCULAR | Status: AC
  Administered 2014-03-09: 12.5 mg via INTRAVENOUS
  Filled 2014-03-09: qty 1

## 2014-03-09 MED ORDER — METOCLOPRAMIDE HCL 5 MG/ML IJ SOLN
10.0000 mg | Freq: Once | INTRAMUSCULAR | Status: AC
  Administered 2014-03-09: 10 mg via INTRAVENOUS
  Filled 2014-03-09: qty 2

## 2014-03-09 MED ORDER — PROMETHAZINE HCL 12.5 MG PO TABS: 12.5000 mg | ORAL_TABLET | Freq: Four times a day (QID) | ORAL | Status: DC | PRN

## 2014-03-09 NOTE — MAU Note (Signed)
Pt feeling better, states she wants to go home.

## 2014-03-09 NOTE — MAU Note (Signed)
Woke up today with severe HA, states she was told by neurologist not to take her fioricet.  Also has sore throat.  C/O nausea & vomiting, denies diarrhea.  Denies bleeding.

## 2014-03-09 NOTE — MAU Provider Note (Signed)
History     CSN: [id number]  Arrival date and time: 03/09/14 1251   First Provider Initiated Contact with Patient 03/09/14 1625      Chief Complaint  Patient presents with  . Headache  . Emesis During Pregnancy   HPI   [name] is a 24 y.o. female G2P0101 at 762w5d who presents with nausea, dizziness and headache. She has a history of headaches and takes fioricet. She called the nurses line who advised her to take fioricet, however she was starting to feel dizzy so she came to the ER.  She has vomited 3 times today. She has been able to keep down some fluids.   OB History    Gravida Para Term Preterm AB TAB SAB Ectopic Multiple Living   2 1  1      1       Past Medical History  Diagnosis Date  . PCOS (polycystic ovarian syndrome)   . Headache     Past Surgical History  Procedure Laterality Date  . Cystectomy    . Back surgery    . Wisdom tooth extraction      Family History  Problem Relation Age of Onset  . Diabetes Mother     History  Substance Use Topics  . Smoking status: Never Smoker   . Smokeless tobacco: Not on file  . Alcohol Use: No     Comment: Occassionally    Allergies: No Known Allergies  Prescriptions prior to admission  Medication Sig Dispense Refill Last Dose  . metFORMIN (GLUMETZA) 500 MG (MOD) 24 hr tablet Take 3 tablets (1,500 mg total) by mouth daily with supper. 90 tablet 11 03/08/2014 at Unknown time  . Omega-3 Fatty Acids (FISH OIL) 1000 MG CAPS Take 1 capsule by mouth daily.    03/08/2014 at Unknown time  . Prenatal Vit-Fe Fumarate-FA (PNV PRENATAL PLUS MULTIVITAMIN) 27-1 MG TABS Take 1 tablet by mouth daily before breakfast. 30 tablet 11 03/08/2014 at Unknown time  . butalbital-acetaminophen-caffeine (FIORICET) 50-325-40 MG per tablet Take 2 tablets by mouth every 6 (six) hours as needed for headache or migraine. (Patient not taking: Reported on 03/09/2014) 40 tablet 2 Not Taking at Unknown time  . ibuprofen (ADVIL,MOTRIN)  800 MG tablet Take 1 tablet (800 mg total) by mouth every 8 (eight) hours as needed. (Patient not taking: Reported on 03/09/2014) 30 tablet 5 Not Taking at Unknown time   Results for orders placed or performed during the hospital encounter of 03/09/14 (from the past 48 hour(s))  Urinalysis, Routine w reflex microscopic     Status: Abnormal   Collection Time: 03/09/14  1:05 PM  Result Value Ref Range   Color, Urine AMBER (A) YELLOW    Comment: BIOCHEMICALS MAY BE AFFECTED BY COLOR   APPearance CLEAR CLEAR   Specific Gravity, Urine 1.020 1.005 - 1.030   pH 6.0 5.0 - 8.0   Glucose, UA NEGATIVE NEGATIVE mg/dL   Hgb urine dipstick NEGATIVE NEGATIVE   Bilirubin Urine SMALL (A) NEGATIVE   Ketones, ur 15 (A) NEGATIVE mg/dL   Protein, ur NEGATIVE NEGATIVE mg/dL   Urobilinogen, UA 1.0 0.0 - 1.0 mg/dL   Nitrite NEGATIVE NEGATIVE   Leukocytes, UA NEGATIVE NEGATIVE    Comment: MICROSCOPIC NOT DONE ON URINES WITH NEGATIVE PROTEIN, BLOOD, LEUKOCYTES, NITRITE, OR GLUCOSE <1000 mg/dL.    Review of Systems  Constitutional: Negative for fever and chills.  Gastrointestinal: Positive for nausea and vomiting. Negative for heartburn, abdominal pain and diarrhea.  Genitourinary: Negative  for dysuria.       Denies vaginal bleeding    Physical Exam   Blood pressure 132/77, pulse 104, temperature 98.4 F (36.9 C), temperature source Oral, resp. rate 18, height  (1.626 m), weight 121.564 kg (268 lb), last menstrual period 01/14/2014, SpO2 99 %.  Physical Exam  Constitutional: She is oriented to person, place, and time. She appears well-developed and well-nourished. No distress.  HENT:  Head: Normocephalic.  Eyes: Pupils are equal, round, and reactive to light.  Neck: Neck supple.  Cardiovascular: Normal rate and normal heart sounds.   Respiratory: Effort normal and breath sounds normal. No respiratory distress.  GI: Soft. She exhibits no distension. There is no tenderness. There is no rebound and no  guarding.  Musculoskeletal: Normal range of motion.  Neurological: She is alert and oriented to person, place, and time.  Skin: Skin is warm. She is not diaphoretic.  Psychiatric: Her behavior is normal.    MAU Course  Procedures  None  MDM  UA shows mild dehydration  D5LR bolus  Decadron 10 mg IV Benadryl 12.5 mg IV Reglan 10 mg IV   Patient states her HA is gone and she feels much better.   Assessment and Plan   A: Nausea and vomiting in pregnancy  Headache in pregnancy   P:  Discharge home in stable condition RX: Phenergan, Reglan Follow up with Dr. Laurell Roof, frequent meals First trimester warning signs   Iona Hansen Melyna Huron, NP 03/09/2014 4:40 PM

## 2014-03-09 NOTE — Telephone Encounter (Signed)
Patient is calling about a headache and sore throat. 11:30 Patient state she started a headache this morning and wants to know what she can do for it. Advised patient she can use the Fioricet Rx she was given- but do not take Tylenol with it. Increase fluids and see if that helps. She may use OTC medications- throat lozenges,spray, tea, Dimetapp- treat symptoms. She states her friend had a sore throat yesterday- but is better today- so she doesn't think she has strep. Told patient if her symptoms persist she may need to be seen at triage for fluids and IV medication to stop her headache. Reviewed reasons for headache during pregnancy. Patient voiced understanding and she is going to take a shower and try her medication she has at home.

## 2014-03-09 NOTE — Discharge Instructions (Signed)
Headaches, Frequently Asked Questions °MIGRAINE HEADACHES °Q: What is migraine? What causes it? How can I treat it? °A: Generally, migraine headaches begin as a dull ache. Then they develop into a constant, throbbing, and pulsating pain. You may experience pain at the temples. You may experience pain at the front or back of one or both sides of the head. The pain is usually accompanied by a combination of: °· Nausea. °· Vomiting. °· Sensitivity to light and noise. °Some people (about 15%) experience an aura (see below) before an attack. The cause of migraine is believed to be chemical reactions in the brain. Treatment for migraine may include over-the-counter or prescription medications. It may also include self-help techniques. These include relaxation training and biofeedback.  °Q: What is an aura? °A: About 15% of people with migraine get an "aura". This is a sign of neurological symptoms that occur before a migraine headache. You may see wavy or jagged lines, dots, or flashing lights. You might experience tunnel vision or blind spots in one or both eyes. The aura can include visual or auditory hallucinations (something imagined). It may include disruptions in smell (such as strange odors), taste or touch. Other symptoms include: °· Numbness. °· A "pins and needles" sensation. °· Difficulty in recalling or speaking the correct word. °These neurological events may last as long as 60 minutes. These symptoms will fade as the headache begins. °Q: What is a trigger? °A: Certain physical or environmental factors can lead to or "trigger" a migraine. These include: °· Foods. °· Hormonal changes. °· Weather. °· Stress. °It is important to remember that triggers are different for everyone. To help prevent migraine attacks, you need to figure out which triggers affect you. Keep a headache diary. This is a good way to track triggers. The diary will help you talk to your healthcare professional about your condition. °Q: Does  weather affect migraines? °A: Bright sunshine, hot, humid conditions, and drastic changes in barometric pressure may lead to, or "trigger," a migraine attack in some people. But studies have shown that weather does not act as a trigger for everyone with migraines. °Q: What is the link between migraine and hormones? °A: Hormones start and regulate many of your body's functions. Hormones keep your body in balance within a constantly changing environment. The levels of hormones in your body are unbalanced at times. Examples are during menstruation, pregnancy, or menopause. That can lead to a migraine attack. In fact, about three quarters of all women with migraine report that their attacks are related to the menstrual cycle.  °Q: Is there an increased risk of stroke for migraine sufferers? °A: The likelihood of a migraine attack causing a stroke is very remote. That is not to say that migraine sufferers cannot have a stroke associated with their migraines. In persons under age 40, the most common associated factor for stroke is migraine headache. But over the course of a person's normal life span, the occurrence of migraine headache may actually be associated with a reduced risk of dying from cerebrovascular disease due to stroke.  °Q: What are acute medications for migraine? °A: Acute medications are used to treat the pain of the headache after it has started. Examples over-the-counter medications, NSAIDs, ergots, and triptans.  °Q: What are the triptans? °A: Triptans are the newest class of abortive medications. They are specifically targeted to treat migraine. Triptans are vasoconstrictors. They moderate some chemical reactions in the brain. The triptans work on receptors in your brain. Triptans help   to restore the balance of a neurotransmitter called serotonin. Fluctuations in levels of serotonin are thought to be a main cause of migraine.  °Q: Are over-the-counter medications for migraine effective? °A:  Over-the-counter, or "OTC," medications may be effective in relieving mild to moderate pain and associated symptoms of migraine. But you should see your caregiver before beginning any treatment regimen for migraine.  °Q: What are preventive medications for migraine? °A: Preventive medications for migraine are sometimes referred to as "prophylactic" treatments. They are used to reduce the frequency, severity, and length of migraine attacks. Examples of preventive medications include antiepileptic medications, antidepressants, beta-blockers, calcium channel blockers, and NSAIDs (nonsteroidal anti-inflammatory drugs). °Q: Why are anticonvulsants used to treat migraine? °A: During the past few years, there has been an increased interest in antiepileptic drugs for the prevention of migraine. They are sometimes referred to as "anticonvulsants". Both epilepsy and migraine may be caused by similar reactions in the brain.  °Q: Why are antidepressants used to treat migraine? °A: Antidepressants are typically used to treat people with depression. They may reduce migraine frequency by regulating chemical levels, such as serotonin, in the brain.  °Q: What alternative therapies are used to treat migraine? °A: The term "alternative therapies" is often used to describe treatments considered outside the scope of conventional Western medicine. Examples of alternative therapy include acupuncture, acupressure, and yoga. Another common alternative treatment is herbal therapy. Some herbs are believed to relieve headache pain. Always discuss alternative therapies with your caregiver before proceeding. Some herbal products contain arsenic and other toxins. °TENSION HEADACHES °Q: What is a tension-type headache? What causes it? How can I treat it? °A: Tension-type headaches occur randomly. They are often the result of temporary stress, anxiety, fatigue, or anger. Symptoms include soreness in your temples, a tightening band-like sensation  around your head (a "vice-like" ache). Symptoms can also include a pulling feeling, pressure sensations, and contracting head and neck muscles. The headache begins in your forehead, temples, or the back of your head and neck. Treatment for tension-type headache may include over-the-counter or prescription medications. Treatment may also include self-help techniques such as relaxation training and biofeedback. °CLUSTER HEADACHES °Q: What is a cluster headache? What causes it? How can I treat it? °A: Cluster headache gets its name because the attacks come in groups. The pain arrives with little, if any, warning. It is usually on one side of the head. A tearing or bloodshot eye and a runny nose on the same side of the headache may also accompany the pain. Cluster headaches are believed to be caused by chemical reactions in the brain. They have been described as the most severe and intense of any headache type. Treatment for cluster headache includes prescription medication and oxygen. °SINUS HEADACHES °Q: What is a sinus headache? What causes it? How can I treat it? °A: When a cavity in the bones of the face and skull (a sinus) becomes inflamed, the inflammation will cause localized pain. This condition is usually the result of an allergic reaction, a tumor, or an infection. If your headache is caused by a sinus blockage, such as an infection, you will probably have a fever. An x-ray will confirm a sinus blockage. Your caregiver's treatment might include antibiotics for the infection, as well as antihistamines or decongestants.  °REBOUND HEADACHES °Q: What is a rebound headache? What causes it? How can I treat it? °A: A pattern of taking acute headache medications too often can lead to a condition known as "rebound headache."   A pattern of taking too much headache medication includes taking it more than 2 days per week or in excessive amounts. That means more than the label or a caregiver advises. With rebound  headaches, your medications not only stop relieving pain, they actually begin to cause headaches. Doctors treat rebound headache by tapering the medication that is being overused. Sometimes your caregiver will gradually substitute a different type of treatment or medication. Stopping may be a challenge. Regularly overusing a medication increases the potential for serious side effects. Consult a caregiver if you regularly use headache medications more than 2 days per week or more than the label advises. ADDITIONAL QUESTIONS AND ANSWERS Q: What is biofeedback? A: Biofeedback is a self-help treatment. Biofeedback uses special equipment to monitor your body's involuntary physical responses. Biofeedback monitors:  Breathing.  Pulse.  Heart rate.  Temperature.  Muscle tension.  Brain activity. Biofeedback helps you refine and perfect your relaxation exercises. You learn to control the physical responses that are related to stress. Once the technique has been mastered, you do not need the equipment any more. Q: Are headaches hereditary? A: Four out of five (80%) of people that suffer report a family history of migraine. Scientists are not sure if this is genetic or a family predisposition. Despite the uncertainty, a child has a 50% chance of having migraine if one parent suffers. The child has a 75% chance if both parents suffer.  Q: Can children get headaches? A: By the time they reach high school, most young people have experienced some type of headache. Many safe and effective approaches or medications can prevent a headache from occurring or stop it after it has begun.  Q: What type of doctor should I see to diagnose and treat my headache? A: Start with your primary caregiver. Discuss his or her experience and approach to headaches. Discuss methods of classification, diagnosis, and treatment. Your caregiver may decide to recommend you to a headache specialist, depending upon your symptoms or other  physical conditions. Having diabetes, allergies, etc., may require a more comprehensive and inclusive approach to your headache. The National Headache Foundation will provide, upon request, a list of [facility] physician members in your state. Document Released: 03/23/2003 Document Revised: 03/25/2011 Document Reviewed: 08/31/2007 [facility] Patient Information 2015 [place], [place]. This information is not intended to replace advice given to you by your health care provider. Make sure you discuss any questions you have with your health care provider.  Morning Sickness Morning sickness is when you feel sick to your stomach (nauseous) during pregnancy. This nauseous feeling may or may not come with vomiting. It often occurs in the morning but can be a problem any time of day. Morning sickness is most common during the first trimester, but it may continue throughout pregnancy. While morning sickness is unpleasant, it is usually harmless unless you develop severe and continual vomiting (hyperemesis gravidarum). This condition requires more intense treatment.  CAUSES  The cause of morning sickness is not completely known but seems to be related to normal hormonal changes that occur in pregnancy. RISK FACTORS You are at greater risk if you:  Experienced nausea or vomiting before your pregnancy.  Had morning sickness during a previous pregnancy.  Are pregnant with more than one baby, such as twins. TREATMENT  Do not use any medicines (prescription, over-the-counter, or herbal) for morning sickness without first talking to your health care provider. Your health care provider may prescribe or recommend:  Vitamin B6 supplements.  Anti-nausea medicines.  The herbal medicine ginger. HOME CARE INSTRUCTIONS   Only take over-the-counter or prescription medicines as directed by your health care provider.  Taking multivitamins before getting pregnant can prevent or decrease the severity of morning sickness in most  women.  Eat a piece of dry toast or unsalted crackers before getting out of bed in the morning.  Eat five or six small meals a day.  Eat dry and bland foods (rice, baked potato). Foods high in carbohydrates are often helpful.  Do not drink liquids with your meals. Drink liquids between meals.  Avoid greasy, fatty, and spicy foods.  Get someone to cook for you if the smell of any food causes nausea and vomiting.  If you feel nauseous after taking prenatal vitamins, take the vitamins at night or with a snack.  Snack on protein foods (nuts, yogurt, cheese) between meals if you are hungry.  Eat unsweetened gelatins for desserts.  Wearing an acupressure wristband (worn for sea sickness) may be helpful.  Acupuncture may be helpful.  Do not smoke.  Get a humidifier to keep the air in your house free of odors.  Get plenty of fresh air. SEEK MEDICAL CARE IF:   Your home remedies are not working, and you need medicine.  You feel dizzy or lightheaded.  You are losing weight. SEEK IMMEDIATE MEDICAL CARE IF:   You have persistent and uncontrolled nausea and vomiting.  You pass out (faint). MAKE SURE YOU:  Understand these instructions.  Will watch your condition.  Will get help right away if you are not doing well or get worse. Document Released: 02/21/2006 Document Revised: 01/05/2013 Document Reviewed: 06/17/2012 [facility] Patient Information 2015 [place]. This information is not intended to replace advice given to you by your health care provider. Make sure you discuss any questions you have with your health care provider.

## 2014-03-28 ENCOUNTER — Encounter: Payer: Self-pay | Admitting: Obstetrics

## 2014-03-28 ENCOUNTER — Other Ambulatory Visit: Payer: Self-pay | Admitting: Obstetrics

## 2014-03-28 ENCOUNTER — Ambulatory Visit (INDEPENDENT_AMBULATORY_CARE_PROVIDER_SITE_OTHER): Payer: Medicaid Other | Admitting: Obstetrics

## 2014-03-28 VITALS — BP 120/77 | HR 101 | Temp 97.5°F | Wt 258.0 lb

## 2014-03-28 DIAGNOSIS — O09891 Supervision of other high risk pregnancies, first trimester: Secondary | ICD-10-CM

## 2014-03-28 DIAGNOSIS — Z3490 Encounter for supervision of normal pregnancy, unspecified, unspecified trimester: Secondary | ICD-10-CM | POA: Diagnosis not present

## 2014-03-28 LAB — POCT URINALYSIS DIPSTICK
Bilirubin, UA: NEGATIVE
Blood, UA: NEGATIVE
Glucose, UA: NEGATIVE
KETONES UA: NEGATIVE
Leukocytes, UA: NEGATIVE
Nitrite, UA: NEGATIVE
PH UA: 6
Protein, UA: NEGATIVE
SPEC GRAV UA: 1.015
UROBILINOGEN UA: NEGATIVE

## 2014-03-28 NOTE — Telephone Encounter (Signed)
Please review

## 2014-03-28 NOTE — Progress Notes (Signed)
Subjective:    Alexandra Raymond is being seen today for her first obstetrical visit.  This is a planned pregnancy. She is at 36105w3d gestation. Her obstetrical history is significant for obesity. Relationship with FOB: spouse, living together. Patient does intend to breast feed. Pregnancy history fully reviewed.  The information documented in the HPI was reviewed and verified.  Menstrual History: OB History    Gravida Para Term Preterm AB TAB SAB Ectopic Multiple Living   2 1  1      1        Patient's last menstrual period was 01/14/2014.    Past Medical History  Diagnosis Date  . PCOS (polycystic ovarian syndrome)   . Headache     Past Surgical History  Procedure Laterality Date  . Cystectomy    . Back surgery    . Wisdom tooth extraction       (Not in a hospital admission) No Known Allergies  History  Substance Use Topics  . Smoking status: Never Smoker   . Smokeless tobacco: Never Used  . Alcohol Use: No     Comment: Occassionally    Family History  Problem Relation Age of Onset  . Diabetes Mother      Review of Systems Constitutional: negative for weight loss Gastrointestinal: negative for vomiting Genitourinary:negative for genital lesions and vaginal discharge and dysuria Musculoskeletal:negative for back pain Behavioral/Psych: negative for abusive relationship, depression, illegal drug usage and tobacco use    Objective:    BP 120/77 mmHg  Pulse 101  Temp(Src) 97.5 F (36.4 C)  Wt 258 lb (117.028 kg)  LMP 01/14/2014 General Appearance:    Alert, cooperative, no distress, appears stated age  Head:    Normocephalic, without obvious abnormality, atraumatic  Eyes:    PERRL, conjunctiva/corneas clear, EOM's intact, fundi    benign, both eyes  Ears:    Normal TM's and external ear canals, both ears  Nose:   Nares normal, septum midline, mucosa normal, no drainage    or sinus tenderness  Throat:   Lips, mucosa, and tongue normal; teeth and gums normal   Neck:   Supple, symmetrical, trachea midline, no adenopathy;    thyroid:  no enlargement/tenderness/nodules; no carotid   bruit or JVD  Back:     Symmetric, no curvature, ROM normal, no CVA tenderness  Lungs:     Clear to auscultation bilaterally, respirations unlabored  Chest Wall:    No tenderness or deformity   Heart:    Regular rate and rhythm, S1 and S2 normal, no murmur, rub   or gallop  Breast Exam:    No tenderness, masses, or nipple abnormality  Abdomen:     Soft, non-tender, bowel sounds active all four quadrants,    no masses, no organomegaly  Genitalia:    Normal female without lesion, discharge or tenderness  Extremities:   Extremities normal, atraumatic, no cyanosis or edema  Pulses:   2+ and symmetric all extremities  Skin:   Skin color, texture, turgor normal, no rashes or lesions  Lymph nodes:   Cervical, supraclavicular, and axillary nodes normal  Neurologic:   CNII-XII intact, normal strength, sensation and reflexes    throughout    Informal Ultrasound:  Positive intrauterine gestation.  FHR 150 BPM   Lab Review Urine pregnancy test Labs reviewed yes Radiologic studies reviewed no  Assessment:    Pregnancy at 57105w3d weeks    Plan:     Prenatal vitamins.  Counseling provided regarding continued use of seat  belts, cessation of alcohol consumption, smoking or use of illicit drugs; infection precautions i.e., influenza/TDAP immunizations, toxoplasmosis,CMV, parvovirus, listeria and varicella; workplace safety, exercise during pregnancy; routine dental care, safe medications, sexual activity, hot tubs, saunas, pools, travel, caffeine use, fish and methlymercury, potential toxins, hair treatments, varicose veins Weight gain recommendations per IOM guidelines reviewed: underweight/BMI< 18.5--> gain 28 - 40 lbs; normal weight/BMI 18.5 - 24.9--> gain 25 - 35 lbs; overweight/BMI 25 - 29.9--> gain 15 - 25 lbs; obese/BMI >30->gain  11 - 20 lbs Problem list reviewed and  updated. FIRST/CF mutation testing/NIPT/QUAD SCREEN/fragile X/Ashkenazi Jewish population testing/Spinal muscular atrophy discussed: requested. Role of ultrasound in pregnancy discussed; fetal survey: requested. Amniocentesis discussed: not indicated. VBAC calculator score: VBAC consent form provided No orders of the defined types were placed in this encounter.   Orders Placed This Encounter  Procedures  . Culture, OB Urine  . Obstetric panel  . HIV antibody  . Hemoglobinopathy evaluation  . Varicella zoster antibody, IgG  . Vit D  25 hydroxy (rtn osteoporosis monitoring)  . POCT urinalysis dipstick    Follow up in 4 weeks.

## 2014-03-29 LAB — OBSTETRIC PANEL
ANTIBODY SCREEN: NEGATIVE
BASOS ABS: 0 10*3/uL (ref 0.0–0.1)
Basophils Relative: 0 % (ref 0–1)
Eosinophils Absolute: 0.1 10*3/uL (ref 0.0–0.7)
Eosinophils Relative: 1 % (ref 0–5)
HEMATOCRIT: 39.7 % (ref 36.0–46.0)
HEP B S AG: NEGATIVE
Hemoglobin: 13 g/dL (ref 12.0–15.0)
LYMPHS ABS: 2.8 10*3/uL (ref 0.7–4.0)
Lymphocytes Relative: 30 % (ref 12–46)
MCH: 29.5 pg (ref 26.0–34.0)
MCHC: 32.7 g/dL (ref 30.0–36.0)
MCV: 90 fL (ref 78.0–100.0)
MPV: 12.4 fL (ref 8.6–12.4)
Monocytes Absolute: 0.5 10*3/uL (ref 0.1–1.0)
Monocytes Relative: 5 % (ref 3–12)
NEUTROS PCT: 64 % (ref 43–77)
Neutro Abs: 5.9 10*3/uL (ref 1.7–7.7)
Platelets: 224 10*3/uL (ref 150–400)
RBC: 4.41 MIL/uL (ref 3.87–5.11)
RDW: 14.3 % (ref 11.5–15.5)
RH TYPE: POSITIVE
Rubella: 1.25 Index — ABNORMAL HIGH (ref <0.90)
WBC: 9.2 10*3/uL (ref 4.0–10.5)

## 2014-03-29 LAB — CULTURE, OB URINE
COLONY COUNT: NO GROWTH
ORGANISM ID, BACTERIA: NO GROWTH

## 2014-03-29 LAB — VITAMIN D 25 HYDROXY (VIT D DEFICIENCY, FRACTURES): VIT D 25 HYDROXY: 20 ng/mL — AB (ref 30–100)

## 2014-03-29 LAB — VARICELLA ZOSTER ANTIBODY, IGG: VARICELLA IGG: 1837 {index} — AB (ref <135.00)

## 2014-03-29 LAB — HIV ANTIBODY (ROUTINE TESTING W REFLEX): HIV: NONREACTIVE

## 2014-03-30 LAB — HEMOGLOBINOPATHY EVALUATION
HGB A2 QUANT: 2.4 % (ref 2.2–3.2)
HGB F QUANT: 0 % (ref 0.0–2.0)
HGB S QUANTITAION: 0 %
Hemoglobin Other: 0 %
Hgb A: 97.6 % (ref 96.8–97.8)

## 2014-04-25 ENCOUNTER — Ambulatory Visit (INDEPENDENT_AMBULATORY_CARE_PROVIDER_SITE_OTHER): Payer: Medicaid Other | Admitting: Obstetrics

## 2014-04-25 ENCOUNTER — Encounter: Payer: Self-pay | Admitting: Obstetrics

## 2014-04-25 VITALS — BP 135/74 | HR 90 | Temp 97.6°F | Wt 258.0 lb

## 2014-04-25 DIAGNOSIS — Z3482 Encounter for supervision of other normal pregnancy, second trimester: Secondary | ICD-10-CM

## 2014-04-25 LAB — POCT URINALYSIS DIPSTICK
BILIRUBIN UA: NEGATIVE
Glucose, UA: NEGATIVE
Ketones, UA: NEGATIVE
LEUKOCYTES UA: NEGATIVE
NITRITE UA: NEGATIVE
Protein, UA: NEGATIVE
RBC UA: NEGATIVE
Spec Grav, UA: 1.015
UROBILINOGEN UA: NEGATIVE
pH, UA: 5.5

## 2014-04-25 NOTE — Progress Notes (Signed)
  Subjective:    Alexandra Raymond is a 24 y.o. female being seen today for her obstetrical visit. She is at 7760w3d gestation. Patient reports: no complaints.  Problem List Items Addressed This Visit    None    Visit Diagnoses    Encounter for supervision of other normal pregnancy in second trimester    -  Primary    Relevant Orders    POCT urinalysis dipstick (Completed)      Patient Active Problem List   Diagnosis Date Noted  . Irregular menstrual cycle 03/03/2013  . PCOS (polycystic ovarian syndrome) 03/03/2013  . Amenorrhea 03/03/2013  . Vaginitis and vulvovaginitis, unspecified 03/03/2013  . Unspecified symptom associated with female genital organs 03/03/2013    Objective:     BP 135/74 mmHg  Pulse 90  Temp(Src) 97.6 F (36.4 C)  Wt 258 lb (117.028 kg)  LMP 01/14/2014 Uterine Size: Below umbilicus     Assessment:    Pregnancy @ 10660w3d  weeks Doing well    Plan:    Problem list reviewed and updated. Labs reviewed.  Follow up in 2 weeks. FIRST/CF mutation testing/NIPT/QUAD SCREEN/fragile X/Ashkenazi Jewish population testing/Spinal muscular atrophy discussed: requested. Role of ultrasound in pregnancy discussed; fetal survey: requested. Amniocentesis discussed: not indicated.

## 2014-05-09 ENCOUNTER — Encounter: Payer: Medicaid Other | Admitting: Obstetrics

## 2014-05-10 ENCOUNTER — Encounter: Payer: Self-pay | Admitting: Obstetrics

## 2014-05-10 ENCOUNTER — Ambulatory Visit (INDEPENDENT_AMBULATORY_CARE_PROVIDER_SITE_OTHER): Payer: Medicaid Other | Admitting: Obstetrics

## 2014-05-10 VITALS — BP 119/78 | HR 86 | Temp 99.1°F | Wt 259.0 lb

## 2014-05-10 DIAGNOSIS — O09213 Supervision of pregnancy with history of pre-term labor, third trimester: Secondary | ICD-10-CM

## 2014-05-10 DIAGNOSIS — E282 Polycystic ovarian syndrome: Secondary | ICD-10-CM

## 2014-05-10 DIAGNOSIS — Z3482 Encounter for supervision of other normal pregnancy, second trimester: Secondary | ICD-10-CM

## 2014-05-10 LAB — POCT URINALYSIS DIPSTICK
Blood, UA: NEGATIVE
Glucose, UA: NEGATIVE
KETONES UA: NEGATIVE
Leukocytes, UA: NEGATIVE
NITRITE UA: NEGATIVE
PH UA: 6
Protein, UA: NEGATIVE
Spec Grav, UA: 1.015

## 2014-05-10 MED ORDER — PNV PRENATAL PLUS MULTIVITAMIN 27-1 MG PO TABS: ORAL_TABLET | ORAL | Status: DC

## 2014-05-10 MED ORDER — METFORMIN HCL ER 500 MG PO TB24: ORAL_TABLET | ORAL | Status: DC

## 2014-05-10 NOTE — Progress Notes (Addendum)
  Subjective:    Alexandra Raymond is a 24 y.o. female being seen today for her obstetrical visit. She is at 3644w4d gestation. Patient reports: no complaints.  Problem List Items Addressed This Visit    PCOS (polycystic ovarian syndrome)   Relevant Medications   metFORMIN (GLUCOPHAGE-XR) 500 MG 24 hr tablet    Other Visit Diagnoses    Previous preterm delivery in third trimester, antepartum    -  Primary    Relevant Orders    US OB Comp + 14 Wk    AMB Referral to Maternal Fetal Medicine (MFM)    Encounter for supervision of other normal pregnancy in second trimester        Relevant Medications    metFORMIN (GLUCOPHAGE-XR) 500 MG 24 hr tablet    Prenatal Vit-Fe Fumarate-FA (PNV PRENATAL PLUS MULTIVITAMIN) 27-1 MG TABS    Other Relevant Orders    POCT urinalysis dipstick (Completed)    AFP, Quad Screen      Patient Active Problem List   Diagnosis Date Noted  . Irregular menstrual cycle 03/03/2013  . PCOS (polycystic ovarian syndrome) 03/03/2013  . Amenorrhea 03/03/2013  . Vaginitis and vulvovaginitis, unspecified 03/03/2013  . Unspecified symptom associated with female genital organs 03/03/2013    Objective:     BP 119/78 mmHg  Pulse 86  Temp(Src) 99.1 F (37.3 C)  Wt 259 lb (117.482 kg)  LMP 01/14/2014 Uterine Size: Below umbilicus     Assessment:    Pregnancy @ 4944w4d  Weeks.  H/O previous preterm delivery at 29 weeks.   Plan:   Ultrasound and MFM referral ordered. May need 17 - P   Problem list reviewed and updated. Labs reviewed.  Follow up in 1 week. FIRST/CF mutation testing/NIPT/QUAD SCREEN/fragile X/Ashkenazi Jewish population testing/Spinal muscular atrophy discussed: requested. Role of ultrasound in pregnancy discussed; fetal survey: requested. Amniocentesis discussed: not indicated.

## 2014-05-10 NOTE — Addendum Note (Signed)
Addended by: Brock BadHARPER, Erique Kaser A on: 05/10/2014 04:37 PM   Modules accepted: Orders, Level of Service

## 2014-05-10 NOTE — Progress Notes (Signed)
Patient reports she is doing well at this time- although she does have a question about her metformin. She wants to know if she will continue that during the whole pregnancy

## 2014-05-10 NOTE — Addendum Note (Signed)
Addended by: Coral CeoHARPER, CHARLES A on: 05/10/2014 04:17 PM   Modules accepted: Orders

## 2014-05-12 ENCOUNTER — Encounter (HOSPITAL_COMMUNITY): Payer: Self-pay

## 2014-05-12 ENCOUNTER — Ambulatory Visit (HOSPITAL_COMMUNITY)
Admission: RE | Admit: 2014-05-12 | Discharge: 2014-05-12 | Disposition: A | Discharge status: Home / Self Care | Payer: Medicaid Other | Source: Ambulatory Visit | Attending: Obstetrics | Admitting: Obstetrics

## 2014-05-12 ENCOUNTER — Other Ambulatory Visit: Payer: Self-pay | Admitting: Obstetrics

## 2014-05-12 ENCOUNTER — Other Ambulatory Visit (HOSPITAL_COMMUNITY): Payer: Self-pay | Admitting: Maternal and Fetal Medicine

## 2014-05-12 DIAGNOSIS — Z8751 Personal history of pre-term labor: Secondary | ICD-10-CM | POA: Insufficient documentation

## 2014-05-12 DIAGNOSIS — O09213 Supervision of pregnancy with history of pre-term labor, third trimester: Secondary | ICD-10-CM

## 2014-05-12 DIAGNOSIS — Z36 Encounter for antenatal screening of mother: Secondary | ICD-10-CM | POA: Insufficient documentation

## 2014-05-12 DIAGNOSIS — Z3A16 16 weeks gestation of pregnancy: Secondary | ICD-10-CM | POA: Diagnosis not present

## 2014-05-12 DIAGNOSIS — O09292 Supervision of pregnancy with other poor reproductive or obstetric history, second trimester: Secondary | ICD-10-CM | POA: Insufficient documentation

## 2014-05-12 DIAGNOSIS — Z3A19 19 weeks gestation of pregnancy: Secondary | ICD-10-CM

## 2014-05-12 DIAGNOSIS — Z3689 Encounter for other specified antenatal screening: Secondary | ICD-10-CM | POA: Insufficient documentation

## 2014-05-12 LAB — AFP, QUAD SCREEN
AFP: 41.6 ng/mL
Age Alone: 1:1120 {titer}
CURR GEST AGE: 16.4 wks.days
Down Syndrome Scr Risk Est: <1:38500 {titer}
HCG, Total: 23.89 IU/mL
INH: 137.8 pg/mL
Interpretation-AFP: NEGATIVE
MOM FOR AFP: 1.7
MOM FOR HCG: 0.98
MOM FOR INH: 1.23
Open Spina bifida: NEGATIVE
Osb Risk: 1:1610 {titer}
TRI 18 SCR RISK EST: NEGATIVE
UE3 VALUE: 1.06 ng/mL
uE3 Mom: 1.31

## 2014-05-17 ENCOUNTER — Ambulatory Visit (INDEPENDENT_AMBULATORY_CARE_PROVIDER_SITE_OTHER): Payer: Medicaid Other | Admitting: Obstetrics

## 2014-05-17 VITALS — BP 131/81 | HR 102 | Temp 97.3°F | Wt 260.0 lb

## 2014-05-17 DIAGNOSIS — Z3482 Encounter for supervision of other normal pregnancy, second trimester: Secondary | ICD-10-CM | POA: Diagnosis not present

## 2014-05-17 DIAGNOSIS — O09892 Supervision of other high risk pregnancies, second trimester: Secondary | ICD-10-CM

## 2014-05-17 LAB — POCT URINALYSIS DIPSTICK
Bilirubin, UA: NEGATIVE
Glucose, UA: NEGATIVE
Ketones, UA: NEGATIVE
Leukocytes, UA: NEGATIVE
Nitrite, UA: NEGATIVE
PH UA: 5
Protein, UA: NEGATIVE
RBC UA: NEGATIVE
Spec Grav, UA: 1.02
UROBILINOGEN UA: NEGATIVE

## 2014-05-18 ENCOUNTER — Encounter: Payer: Self-pay | Admitting: Obstetrics

## 2014-05-18 NOTE — Progress Notes (Addendum)
  Subjective:    Alexandra Raymond is a 24 y.o. female being seen today for her obstetrical visit. She is at 1990w5d gestation. Patient reports: no complaints.  Problem List Items Addressed This Visit    None    Visit Diagnoses    Encounter for supervision of other normal pregnancy in second trimester    -  Primary    Relevant Orders    POCT urinalysis dipstick (Completed)      Patient Active Problem List   Diagnosis Date Noted  . Personal history of pre-term labor   . Encounter for fetal anatomic survey   . [redacted] weeks gestation of pregnancy   . Irregular menstrual cycle 03/03/2013  . PCOS (polycystic ovarian syndrome) 03/03/2013  . Amenorrhea 03/03/2013  . Vaginitis and vulvovaginitis, unspecified 03/03/2013  . Unspecified symptom associated with female genital organs 03/03/2013    Objective:     BP 131/81 mmHg  Pulse 102  Temp(Src) 97.3 F (36.3 C)  Wt 260 lb (117.935 kg)  LMP 01/14/2014 Uterine Size: Below umbilicus     Assessment:    Pregnancy @ 6190w5d  Weeks.  H/O previous preterm delivery at 29 weeks. Doing well    Plan:   Start 17 - P weekly injections next week.   Problem list reviewed and updated. Labs reviewed.  Follow up in 4 weeks. FIRST/CF mutation testing/NIPT/QUAD SCREEN/fragile X/Ashkenazi Jewish population testing/Spinal muscular atrophy discussed: requested. Role of ultrasound in pregnancy discussed; fetal survey: requested. Amniocentesis discussed: not indicated.

## 2014-05-27 ENCOUNTER — Encounter (HOSPITAL_COMMUNITY): Payer: Self-pay

## 2014-05-27 ENCOUNTER — Other Ambulatory Visit (INDEPENDENT_AMBULATORY_CARE_PROVIDER_SITE_OTHER): Payer: Medicaid Other | Admitting: *Deleted

## 2014-05-27 ENCOUNTER — Ambulatory Visit (HOSPITAL_COMMUNITY)
Admission: RE | Admit: 2014-05-27 | Discharge: 2014-05-27 | Disposition: A | Discharge status: Home / Self Care | Payer: Medicaid Other | Source: Ambulatory Visit | Attending: Obstetrics | Admitting: Obstetrics

## 2014-05-27 VITALS — BP 117/75 | HR 88 | Temp 98.4°F | Wt 259.0 lb

## 2014-05-27 DIAGNOSIS — Z36 Encounter for antenatal screening of mother: Secondary | ICD-10-CM | POA: Insufficient documentation

## 2014-05-27 DIAGNOSIS — O09212 Supervision of pregnancy with history of pre-term labor, second trimester: Secondary | ICD-10-CM | POA: Diagnosis not present

## 2014-05-27 DIAGNOSIS — Z3A19 19 weeks gestation of pregnancy: Secondary | ICD-10-CM | POA: Insufficient documentation

## 2014-05-27 DIAGNOSIS — O09892 Supervision of other high risk pregnancies, second trimester: Secondary | ICD-10-CM

## 2014-05-27 DIAGNOSIS — Z8751 Personal history of pre-term labor: Secondary | ICD-10-CM | POA: Insufficient documentation

## 2014-05-27 MED ORDER — HYDROXYPROGESTERONE CAPROATE 250 MG/ML IM OIL
250.0000 mg | TOPICAL_OIL | INTRAMUSCULAR | Status: AC
  Administered 2014-05-27 – 2014-09-09 (×16): 250 mg via INTRAMUSCULAR

## 2014-05-27 NOTE — Progress Notes (Signed)
Pt is in office today for 17P injection.  Pt tolerated injection well.  BP 117/75 mmHg  Pulse 88  Temp(Src) 98.4 F (36.9 C)  Wt 259 lb (117.482 kg)  LMP 01/14/2014  Administrations This Visit    hydroxyprogesterone caproate (DELALUTIN) 250 mg/mL injection 250 mg    Admin Date Action Dose Route Administered By         05/27/2014 Given 250 mg Intramuscular Lanney GinsSuzanne D Daymion Nazaire, CMA

## 2014-05-31 ENCOUNTER — Encounter: Payer: Medicaid Other | Admitting: Obstetrics

## 2014-05-31 ENCOUNTER — Encounter: Payer: Self-pay | Admitting: *Deleted

## 2014-05-31 ENCOUNTER — Encounter: Payer: Self-pay | Admitting: Neurology

## 2014-05-31 ENCOUNTER — Ambulatory Visit (INDEPENDENT_AMBULATORY_CARE_PROVIDER_SITE_OTHER): Payer: Medicaid Other | Admitting: Neurology

## 2014-05-31 ENCOUNTER — Other Ambulatory Visit: Payer: Medicaid Other

## 2014-05-31 VITALS — BP 130/70 | HR 86 | Resp 20 | Ht 64.0 in | Wt 259.4 lb

## 2014-05-31 DIAGNOSIS — G43009 Migraine without aura, not intractable, without status migrainosus: Secondary | ICD-10-CM | POA: Diagnosis not present

## 2014-05-31 DIAGNOSIS — Z3A19 19 weeks gestation of pregnancy: Secondary | ICD-10-CM | POA: Diagnosis not present

## 2014-05-31 NOTE — Patient Instructions (Signed)
1.  If you get a migraine, take Tylenol or the cold shower.  If nothing helps, then you may take a Fioricet but use it as a last resort.  You should only use it sparingly as it can cause rebound headache. 2.  Follow up in 6 months but call sooner with any problems.

## 2014-05-31 NOTE — Progress Notes (Signed)
NEUROLOGY FOLLOW UP OFFICE NOTE  Alexandra Raymond [id number]  HISTORY OF PRESENT ILLNESS: Alexandra Raymond is a 24 year old right-handed female with PCOS and 19 gestation who follows up for migraine without aura.  UPDATE: Migraines had improved since her pregnancy.  Over the last 6 months, she has had 6 migraines.  3 of them responded to Tylenol.  2 of them responded to a cold shower when Tylenol did not help.  She had one migraine in December which did not resolve and she went to the ED.  HISTORY: Onset:  [age] to 24 years old Location:  Bifrontal/retro-orbital Quality:  pressure Initial Intensity:  9-10/10 Aura:  no Prodrome:  no Associated symptoms:  Photophobia.  No nausea, phonophobia, or visual disturbance Initial Duration:  All day (30 minutes with ASA) Initial Frequency:  8 days per month (2-3 days per month severe) up until 6 weeks ago, when she found out she was pregnant. Triggers/exacerbating factors:  none Relieving factors:  Cold rag, rest Activity:  2 or 3 times a month she cannot function.  Past abortive therapy:  ASA (effective), Excedrin Migraine (effective), Tylenol, a triptan (effective) Past preventative therapy:  none  Current abortive therapy:  Fioricet, ibuprofen 800mg  Current preventative therapy:  none  Caffeine:  no Alcohol:  no Smoker:  no Diet:  Needs to improve Exercise:  no Depression/stress:  no Sleep hygiene:  good Family history of headache:  no  PAST MEDICAL HISTORY: Past Medical History  Diagnosis Date  . PCOS (polycystic ovarian syndrome)   . Headache     MEDICATIONS: Current Outpatient Prescriptions on File Prior to Visit  Medication Sig Dispense Refill  . metFORMIN (GLUCOPHAGE-XR) 500 MG 24 hr tablet take 3 tablets by mouth DAILY WITH DINNER 90 tablet 11  . metoCLOPramide (REGLAN) 10 MG tablet Take 1 tablet (10 mg total) by mouth 3 (three) times daily before meals. 30 tablet 0  . Omega-3 Fatty Acids (FISH OIL) 1000 MG CAPS  Take 1 capsule by mouth daily.     . Prenatal Vit-Fe Fumarate-FA (PNV PRENATAL PLUS MULTIVITAMIN) 27-1 MG TABS take 1 tablet by mouth once daily BEFORE BREAKFAST 30 tablet 11  . promethazine (PHENERGAN) 12.5 MG tablet Take 1 tablet (12.5 mg total) by mouth every 6 (six) hours as needed for nausea or vomiting. 30 tablet 0  . butalbital-acetaminophen-caffeine (FIORICET) 50-325-40 MG per tablet Take 2 tablets by mouth every 6 (six) hours as needed for headache or migraine. (Patient not taking: Reported on 05/31/2014) 40 tablet 2   Current Facility-Administered Medications on File Prior to Visit  Medication Dose Route Frequency Provider Last Rate Last Dose  . hydroxyprogesterone caproate (DELALUTIN) 250 mg/mL injection 250 mg  250 mg Intramuscular Weekly Brock Badharles A Harper, MD   250 mg at 05/27/14 1203    ALLERGIES: No Known Allergies  FAMILY HISTORY: Family History  Problem Relation Age of Onset  . Diabetes Mother     SOCIAL HISTORY: History   Social History  . Marital Status: Married    Spouse Name: N/A  . Number of Children: N/A  . Years of Education: N/A   Occupational History  . Not on file.   Social History Main Topics  . Smoking status: Never Smoker   . Smokeless tobacco: Never Used  . Alcohol Use: No     Comment: Occassionally  . Drug Use: No  . Sexual Activity:    Partners: Male    Birth Control/ Protection: None   Other Topics Concern  .  Not on file   Social History Narrative    REVIEW OF SYSTEMS: Constitutional: No fevers, chills, or sweats, no generalized fatigue, change in appetite Eyes: No visual changes, double vision, eye pain Ear, nose and throat: No hearing loss, ear pain, nasal congestion, sore throat Cardiovascular: No chest pain, palpitations Respiratory:  No shortness of breath at rest or with exertion, wheezes GastrointestinaI: No nausea, vomiting, diarrhea, abdominal pain, fecal incontinence Genitourinary:  No dysuria, urinary retention or  frequency Musculoskeletal:  No neck pain, back pain Integumentary: No rash, pruritus, skin lesions Neurological: as above Psychiatric: No depression, insomnia, anxiety Endocrine: No palpitations, fatigue, diaphoresis, mood swings, change in appetite, change in weight, increased thirst Hematologic/Lymphatic:  No anemia, purpura, petechiae. Allergic/Immunologic: no itchy/runny eyes, nasal congestion, recent allergic reactions, rashes  PHYSICAL EXAM: Filed Vitals:   05/31/14 0854  BP: 130/70  Pulse: 86  Resp: 20   General: No acute distress Head:  Normocephalic/atraumatic Eyes:  Fundoscopic exam unremarkable without vessel changes, exudates, hemorrhages or papilledema. Neck: supple, no paraspinal tenderness, full range of motion Heart:  Regular rate and rhythm Lungs:  Clear to auscultation bilaterally Back: No paraspinal tenderness Neurological Exam: alert and oriented to person, place, and time. Attention span and concentration intact, recent and remote memory intact, fund of knowledge intact.  Speech fluent and not dysarthric, language intact.  CN II-XII intact. Fundoscopic exam unremarkable without vessel changes, exudates, hemorrhages or papilledema.  Bulk and tone normal, muscle strength 5/5 throughout.  Sensation to light touch, temperature and vibration intact.  Deep tendon reflexes 2+ throughout, toes downgoing.  Finger to nose and heel to shin testing intact.  Gait normal, Romberg negative.  IMPRESSION: Migraine without aura, improved [redacted] weeks gestation  PLAN: 1.  Tylenol or cold shower for abortive therapy.  Since headaches are so infrequent, may use Fioricet sparingly as last resort. 2.  Follow up in 6 months but call sooner with any problems.  15 minutes spent with patient, over 50% spent discussing management.  Shon MilletAdam Marquel Spoto, DO  CC: Coral Ceoharles Harper, MD

## 2014-06-03 ENCOUNTER — Ambulatory Visit (INDEPENDENT_AMBULATORY_CARE_PROVIDER_SITE_OTHER): Payer: Medicaid Other | Admitting: *Deleted

## 2014-06-03 VITALS — BP 117/73 | HR 86 | Temp 98.3°F | Wt 258.0 lb

## 2014-06-03 DIAGNOSIS — O2691 Pregnancy related conditions, unspecified, first trimester: Secondary | ICD-10-CM | POA: Diagnosis not present

## 2014-06-03 DIAGNOSIS — O09892 Supervision of other high risk pregnancies, second trimester: Secondary | ICD-10-CM

## 2014-06-03 NOTE — Progress Notes (Signed)
Pt is in office today for 17P injection.  Pt tolerated injection well.  Pt states she is doing well. Pt has appt for next week.  BP 117/73 mmHg  Pulse 86  Temp(Src) 98.3 F (36.8 C)  Wt 258 lb (117.028 kg)  LMP 01/14/2014  Administrations This Visit    hydroxyprogesterone caproate (DELALUTIN) 250 mg/mL injection 250 mg    Admin Date Action Dose Route Administered By         06/03/2014 Given 250 mg Intramuscular Lanney GinsSuzanne D Saturnino Liew, CMA

## 2014-06-10 ENCOUNTER — Ambulatory Visit (INDEPENDENT_AMBULATORY_CARE_PROVIDER_SITE_OTHER): Payer: Medicaid Other | Admitting: *Deleted

## 2014-06-10 ENCOUNTER — Encounter (HOSPITAL_COMMUNITY): Payer: Self-pay

## 2014-06-10 ENCOUNTER — Other Ambulatory Visit (HOSPITAL_COMMUNITY): Payer: Self-pay | Admitting: Maternal and Fetal Medicine

## 2014-06-10 ENCOUNTER — Ambulatory Visit (HOSPITAL_COMMUNITY)
Admission: RE | Admit: 2014-06-10 | Discharge: 2014-06-10 | Disposition: A | Discharge status: Home / Self Care | Payer: Medicaid Other | Source: Ambulatory Visit | Attending: Obstetrics | Admitting: Obstetrics

## 2014-06-10 VITALS — BP 124/73 | HR 89 | Temp 97.3°F | Ht 64.0 in | Wt 259.0 lb

## 2014-06-10 DIAGNOSIS — Z8751 Personal history of pre-term labor: Secondary | ICD-10-CM | POA: Diagnosis not present

## 2014-06-10 DIAGNOSIS — Z3A19 19 weeks gestation of pregnancy: Secondary | ICD-10-CM

## 2014-06-10 NOTE — Progress Notes (Signed)
Patient in office today for her 17-P injection. Patient tolerated injection well.  Patient advised to schedule appointment in 1 week for her next injection.  BP 124/73 mmHg  Pulse 89  Temp(Src) 97.3 F (36.3 C)  Ht 5\' 4"  (1.626 m)  Wt 259 lb (117.482 kg)  BMI 44.44 kg/m2  LMP 01/14/2014   Administrations This Visit    hydroxyprogesterone caproate (DELALUTIN) 250 mg/mL injection 250 mg    Admin Date Action Dose Route Administered By         06/10/2014 Given 250 mg Intramuscular Henriette CombsAndrea L Hatton, LPN

## 2014-06-14 ENCOUNTER — Ambulatory Visit (INDEPENDENT_AMBULATORY_CARE_PROVIDER_SITE_OTHER): Payer: Medicaid Other | Admitting: Obstetrics

## 2014-06-14 VITALS — BP 113/78 | HR 95 | Temp 98.5°F | Wt 261.0 lb

## 2014-06-14 DIAGNOSIS — Z3482 Encounter for supervision of other normal pregnancy, second trimester: Secondary | ICD-10-CM | POA: Diagnosis not present

## 2014-06-14 LAB — POCT URINALYSIS DIPSTICK
BILIRUBIN UA: NEGATIVE
Blood, UA: NEGATIVE
GLUCOSE UA: NEGATIVE
KETONES UA: NEGATIVE
LEUKOCYTES UA: NEGATIVE
NITRITE UA: NEGATIVE
PH UA: 6.5
Protein, UA: NEGATIVE
Spec Grav, UA: 1.015
Urobilinogen, UA: NEGATIVE

## 2014-06-15 ENCOUNTER — Encounter: Payer: Self-pay | Admitting: Obstetrics

## 2014-06-15 NOTE — Progress Notes (Signed)
Subjective:    Alexandra Raymond is a 24 y.o. female being seen today for her obstetrical visit. She is at 4655w5d gestation. Patient reports: no complaints . Fetal movement: normal.  Problem List Items Addressed This Visit    None    Visit Diagnoses    Encounter for supervision of other normal pregnancy in second trimester    -  Primary    Relevant Orders    POCT urinalysis dipstick (Completed)      Patient Active Problem List   Diagnosis Date Noted  . [redacted] weeks gestation of pregnancy   . History of preterm delivery   . Personal history of pre-term labor   . Encounter for fetal anatomic survey   . [redacted] weeks gestation of pregnancy   . Irregular menstrual cycle 03/03/2013  . PCOS (polycystic ovarian syndrome) 03/03/2013  . Amenorrhea 03/03/2013  . Vaginitis and vulvovaginitis, unspecified 03/03/2013  . Unspecified symptom associated with female genital organs 03/03/2013   Objective:    BP 113/78 mmHg  Pulse 95  Temp(Src) 98.5 F (36.9 C)  Wt 261 lb (118.389 kg)  LMP 01/14/2014 FHT: 150 BPM  Uterine Size: size equals dates     Assessment:    Pregnancy @ 7555w5d    Plan:      Labs, problem list reviewed and updated 2 hr GTT planned Follow up in 4 weeks.

## 2014-06-17 ENCOUNTER — Ambulatory Visit (INDEPENDENT_AMBULATORY_CARE_PROVIDER_SITE_OTHER): Payer: Medicaid Other | Admitting: *Deleted

## 2014-06-17 VITALS — BP 117/77 | HR 96 | Temp 97.3°F | Wt 261.0 lb

## 2014-06-17 DIAGNOSIS — Z029 Encounter for administrative examinations, unspecified: Secondary | ICD-10-CM

## 2014-06-17 DIAGNOSIS — Z8751 Personal history of pre-term labor: Secondary | ICD-10-CM

## 2014-06-17 NOTE — Progress Notes (Signed)
Patient in office today for a 17-P injection. Patient tolerated injection well. Patient is aware to return in 1 week for her next injection.   BP 117/77 mmHg  Pulse 96  Temp(Src) 97.3 F (36.3 C)  Wt 261 lb (118.389 kg)  LMP 01/14/2014   Administrations This Visit    hydroxyprogesterone caproate (DELALUTIN) 250 mg/mL injection 250 mg    Admin Date Action Dose Route Administered By         06/17/2014 Given 250 mg Intramuscular Henriette CombsAndrea L Khris Jansson, LPN

## 2014-06-24 ENCOUNTER — Ambulatory Visit (INDEPENDENT_AMBULATORY_CARE_PROVIDER_SITE_OTHER): Payer: Medicaid Other | Admitting: *Deleted

## 2014-06-24 ENCOUNTER — Encounter (HOSPITAL_COMMUNITY): Payer: Self-pay

## 2014-06-24 ENCOUNTER — Ambulatory Visit (HOSPITAL_COMMUNITY)
Admission: RE | Admit: 2014-06-24 | Discharge: 2014-06-24 | Disposition: A | Discharge status: Home / Self Care | Payer: Medicaid Other | Source: Ambulatory Visit | Attending: Obstetrics | Admitting: Obstetrics

## 2014-06-24 VITALS — BP 115/75 | HR 81 | Temp 97.8°F | Ht 64.0 in | Wt 260.0 lb

## 2014-06-24 DIAGNOSIS — Z36 Encounter for antenatal screening of mother: Secondary | ICD-10-CM | POA: Insufficient documentation

## 2014-06-24 DIAGNOSIS — Z8751 Personal history of pre-term labor: Secondary | ICD-10-CM | POA: Diagnosis not present

## 2014-06-24 DIAGNOSIS — Z3A23 23 weeks gestation of pregnancy: Secondary | ICD-10-CM | POA: Diagnosis not present

## 2014-06-24 DIAGNOSIS — O09292 Supervision of pregnancy with other poor reproductive or obstetric history, second trimester: Secondary | ICD-10-CM | POA: Insufficient documentation

## 2014-06-24 DIAGNOSIS — Z3A19 19 weeks gestation of pregnancy: Secondary | ICD-10-CM

## 2014-06-24 NOTE — Progress Notes (Signed)
Patient in office for 17-P injection. Patient tolerated injection well. Patient aware to return in 1 week for next injection.  BP 115/75 mmHg  Pulse 81  Temp(Src) 97.8 F (36.6 C)  Ht 5\' 4"  (1.626 m)  Wt 260 lb (117.935 kg)  BMI 44.61 kg/m2  LMP 01/14/2014  Administrations This Visit    hydroxyprogesterone caproate (DELALUTIN) 250 mg/mL injection 250 mg    Admin Date Action Dose Route Administered By         06/24/2014 Given 250 mg Intramuscular Henriette Combs, LPN

## 2014-07-01 ENCOUNTER — Ambulatory Visit (INDEPENDENT_AMBULATORY_CARE_PROVIDER_SITE_OTHER): Payer: Medicaid Other | Admitting: *Deleted

## 2014-07-01 VITALS — BP 126/71 | HR 103 | Wt 263.0 lb

## 2014-07-01 DIAGNOSIS — O09892 Supervision of other high risk pregnancies, second trimester: Secondary | ICD-10-CM

## 2014-07-01 DIAGNOSIS — O0992 Supervision of high risk pregnancy, unspecified, second trimester: Secondary | ICD-10-CM

## 2014-07-01 NOTE — Progress Notes (Signed)
Pt is in office today for 17p injection.  Pt tolerated well.  Pt has no other concerns today.   BP 126/71 mmHg  Pulse 103  Wt 263 lb (119.296 kg)  LMP 01/14/2014  Administrations This Visit    hydroxyprogesterone caproate (DELALUTIN) 250 mg/mL injection 250 mg    Admin Date Action Dose Route Administered By         07/01/2014 Given 250 mg Intramuscular Lanney Gins, CMA

## 2014-07-08 ENCOUNTER — Ambulatory Visit (INDEPENDENT_AMBULATORY_CARE_PROVIDER_SITE_OTHER): Payer: Medicaid Other | Admitting: *Deleted

## 2014-07-08 ENCOUNTER — Ambulatory Visit (HOSPITAL_COMMUNITY)
Admission: RE | Admit: 2014-07-08 | Discharge: 2014-07-08 | Disposition: A | Discharge status: Home / Self Care | Payer: Medicaid Other | Source: Ambulatory Visit | Attending: Obstetrics | Admitting: Obstetrics

## 2014-07-08 VITALS — BP 127/86 | HR 89 | Temp 98.5°F

## 2014-07-08 DIAGNOSIS — O0992 Supervision of high risk pregnancy, unspecified, second trimester: Secondary | ICD-10-CM | POA: Diagnosis not present

## 2014-07-08 DIAGNOSIS — Z8751 Personal history of pre-term labor: Secondary | ICD-10-CM

## 2014-07-08 DIAGNOSIS — O09892 Supervision of other high risk pregnancies, second trimester: Secondary | ICD-10-CM

## 2014-07-08 DIAGNOSIS — O09212 Supervision of pregnancy with history of pre-term labor, second trimester: Secondary | ICD-10-CM | POA: Insufficient documentation

## 2014-07-08 DIAGNOSIS — Z3A25 25 weeks gestation of pregnancy: Secondary | ICD-10-CM | POA: Diagnosis not present

## 2014-07-08 DIAGNOSIS — Z3A19 19 weeks gestation of pregnancy: Secondary | ICD-10-CM

## 2014-07-08 NOTE — Progress Notes (Signed)
Pt is in office for 17p injection.  Injection given, pt tolerated well.  Pt is to make appt for next injection at check out today.  Pt has ROB appt on Monday.   Pt states no other concerns today.   BP 127/86 mmHg  Pulse 89  Temp(Src) 98.5 F (36.9 C)  LMP 01/14/2014  Administrations This Visit    hydroxyprogesterone caproate (DELALUTIN) 250 mg/mL injection 250 mg    Admin Date Action Dose Route Administered By         07/08/2014 Given 250 mg Intramuscular Lanney Gins, CMA

## 2014-07-11 ENCOUNTER — Encounter: Payer: Self-pay | Admitting: Obstetrics

## 2014-07-11 ENCOUNTER — Ambulatory Visit (INDEPENDENT_AMBULATORY_CARE_PROVIDER_SITE_OTHER): Payer: Medicaid Other | Admitting: Obstetrics

## 2014-07-11 VITALS — BP 130/77 | HR 98 | Temp 97.9°F | Wt 266.0 lb

## 2014-07-11 DIAGNOSIS — Z3482 Encounter for supervision of other normal pregnancy, second trimester: Secondary | ICD-10-CM | POA: Diagnosis not present

## 2014-07-11 LAB — POCT URINALYSIS DIPSTICK
Bilirubin, UA: NEGATIVE
GLUCOSE UA: NORMAL
Ketones, UA: NEGATIVE
Leukocytes, UA: NEGATIVE
Nitrite, UA: NEGATIVE
PH UA: 6
Protein, UA: NEGATIVE
RBC UA: NEGATIVE
SPEC GRAV UA: 1.015
UROBILINOGEN UA: NEGATIVE

## 2014-07-11 NOTE — Progress Notes (Signed)
Subjective:    Alexandra Raymond is a 24 y.o. female being seen today for her obstetrical visit. She is at 2724w3d gestation. Patient reports: no complaints . Fetal movement: normal.  Problem List Items Addressed This Visit    None    Visit Diagnoses    Supervision of other normal pregnancy, antepartum, second trimester    -  Primary    Relevant Orders    POCT urinalysis dipstick (Completed)      Patient Active Problem List   Diagnosis Date Noted  . [redacted] weeks gestation of pregnancy   . [redacted] weeks gestation of pregnancy   . History of preterm delivery   . Personal history of pre-term labor   . Encounter for fetal anatomic survey   . [redacted] weeks gestation of pregnancy   . Irregular menstrual cycle 03/03/2013  . PCOS (polycystic ovarian syndrome) 03/03/2013  . Amenorrhea 03/03/2013  . Vaginitis and vulvovaginitis, unspecified 03/03/2013  . Unspecified symptom associated with female genital organs 03/03/2013   Objective:    BP 130/77 mmHg  Pulse 98  Temp(Src) 97.9 F (36.6 C)  Wt 266 lb (120.657 kg)  LMP 01/14/2014 FHT: 150 BPM  Uterine Size: size equals dates     Assessment:    Pregnancy @ 1924w3d    Plan:    OBGCT: ordered.  Labs, problem list reviewed and updated 2 hr GTT planned Follow up in 1 weeks.

## 2014-07-13 ENCOUNTER — Other Ambulatory Visit: Payer: Self-pay | Admitting: Certified Nurse Midwife

## 2014-07-15 ENCOUNTER — Ambulatory Visit: Payer: Medicaid Other

## 2014-07-15 ENCOUNTER — Ambulatory Visit (INDEPENDENT_AMBULATORY_CARE_PROVIDER_SITE_OTHER): Payer: Medicaid Other | Admitting: Obstetrics

## 2014-07-15 VITALS — BP 112/80 | HR 103 | Temp 97.4°F | Wt 264.2 lb

## 2014-07-15 DIAGNOSIS — O0993 Supervision of high risk pregnancy, unspecified, third trimester: Secondary | ICD-10-CM

## 2014-07-15 DIAGNOSIS — Z3483 Encounter for supervision of other normal pregnancy, third trimester: Secondary | ICD-10-CM

## 2014-07-15 DIAGNOSIS — Z8751 Personal history of pre-term labor: Secondary | ICD-10-CM | POA: Diagnosis not present

## 2014-07-15 NOTE — Progress Notes (Signed)
Pt. Was seen today in the office for her 17p injection. Pt. Did well. Pt. Was not seen by a provider at todays visit. Pt. Had ROB on Monday 6/27  BP 112/80 mmHg   Pulse 103   Temp(Src) 97.4 F (36.3 C)   Wt 264 lb 3.2 oz (119.84 kg)   LMP 01/14/2014  Administrations This Visit    hydroxyprogesterone caproate (DELALUTIN) 250 mg/mL injection 250 mg    Admin Date Action Dose Route Administered By         07/15/2014 Given 250 mg Intramuscular Lanney GinsSuzanne D Foster, CMA

## 2014-07-15 NOTE — Progress Notes (Signed)
Nursing visit for 17- P injection only.

## 2014-07-22 ENCOUNTER — Ambulatory Visit (INDEPENDENT_AMBULATORY_CARE_PROVIDER_SITE_OTHER): Payer: Medicaid Other | Admitting: *Deleted

## 2014-07-22 ENCOUNTER — Ambulatory Visit (HOSPITAL_COMMUNITY): Payer: Medicaid Other

## 2014-07-22 VITALS — BP 125/78 | HR 99 | Temp 96.9°F | Ht 64.0 in | Wt 263.8 lb

## 2014-07-22 DIAGNOSIS — O09892 Supervision of other high risk pregnancies, second trimester: Secondary | ICD-10-CM

## 2014-07-22 DIAGNOSIS — O09212 Supervision of pregnancy with history of pre-term labor, second trimester: Secondary | ICD-10-CM | POA: Diagnosis not present

## 2014-07-22 NOTE — Progress Notes (Signed)
Pt. Was seen in the office today for a 17p injection. Pt. Received injection on her right side at this visit. Pt. Complained of a little burning afterwards. Pt. Was monitored for 15 min. Pt. Stated she felt better and was released to go home. Pt. Was also given a warming pack to take home with her today.

## 2014-07-29 ENCOUNTER — Encounter: Payer: Medicaid Other | Admitting: Certified Nurse Midwife

## 2014-07-29 ENCOUNTER — Ambulatory Visit (INDEPENDENT_AMBULATORY_CARE_PROVIDER_SITE_OTHER): Payer: Medicaid Other | Admitting: *Deleted

## 2014-07-29 VITALS — BP 119/75 | HR 92 | Temp 98.9°F | Wt 264.0 lb

## 2014-07-29 DIAGNOSIS — Z3482 Encounter for supervision of other normal pregnancy, second trimester: Secondary | ICD-10-CM | POA: Diagnosis not present

## 2014-07-29 NOTE — Progress Notes (Signed)
Pt is in office today for 17p injection.  Pt tolerated injection well.  Pt has next appt currently scheduled.  Pt states that she has been having back pain.  Pt advised to try comfort measures as well as able to take tylenol as needed. Pt advised to contact office next week if pain worsens or comfort measures do not help.  BP 119/75 mmHg  Pulse 92  Temp(Src) 98.9 F (37.2 C)  Wt 264 lb (119.75 kg)  LMP 01/14/2014   Administrations This Visit    hydroxyprogesterone caproate (DELALUTIN) 250 mg/mL injection 250 mg    Admin Date Action Dose Route Administered By         07/29/2014 Given 250 mg Intramuscular Lanney GinsSuzanne D Jasenia Weilbacher, CMA

## 2014-08-05 ENCOUNTER — Other Ambulatory Visit (HOSPITAL_COMMUNITY): Payer: Self-pay | Admitting: Maternal and Fetal Medicine

## 2014-08-05 ENCOUNTER — Ambulatory Visit (HOSPITAL_COMMUNITY)
Admission: RE | Admit: 2014-08-05 | Discharge: 2014-08-05 | Disposition: A | Discharge status: Home / Self Care | Payer: Medicaid Other | Source: Ambulatory Visit | Attending: Obstetrics | Admitting: Obstetrics

## 2014-08-05 ENCOUNTER — Encounter (HOSPITAL_COMMUNITY): Payer: Self-pay

## 2014-08-05 ENCOUNTER — Ambulatory Visit: Payer: Medicaid Other | Admitting: *Deleted

## 2014-08-05 VITALS — BP 120/70 | HR 81 | Temp 99.2°F | Wt 262.0 lb

## 2014-08-05 DIAGNOSIS — O09892 Supervision of other high risk pregnancies, second trimester: Secondary | ICD-10-CM

## 2014-08-05 DIAGNOSIS — Z8751 Personal history of pre-term labor: Secondary | ICD-10-CM

## 2014-08-05 DIAGNOSIS — O09212 Supervision of pregnancy with history of pre-term labor, second trimester: Secondary | ICD-10-CM | POA: Insufficient documentation

## 2014-08-05 DIAGNOSIS — Z3A19 19 weeks gestation of pregnancy: Secondary | ICD-10-CM | POA: Insufficient documentation

## 2014-08-05 DIAGNOSIS — Z3A29 29 weeks gestation of pregnancy: Secondary | ICD-10-CM | POA: Insufficient documentation

## 2014-08-05 DIAGNOSIS — O99210 Obesity complicating pregnancy, unspecified trimester: Secondary | ICD-10-CM | POA: Insufficient documentation

## 2014-08-05 NOTE — Progress Notes (Signed)
Pt is in office for 17p injection.  Pt tolerated injection well.  Pt has no other concerns today.  Pt has next appt scheuled for next week.  BP 120/70 mmHg  Pulse 81  Temp(Src) 99.2 F (37.3 C)  Wt 262 lb (118.842 kg)  LMP 01/14/2014  Administrations This Visit    hydroxyprogesterone caproate (DELALUTIN) 250 mg/mL injection 250 mg    Admin Date Action Dose Route Administered By         08/05/2014 Given 250 mg Intramuscular Lanney Gins, CMA

## 2014-08-12 ENCOUNTER — Ambulatory Visit: Payer: Medicaid Other

## 2014-08-12 ENCOUNTER — Ambulatory Visit (INDEPENDENT_AMBULATORY_CARE_PROVIDER_SITE_OTHER): Payer: Medicaid Other | Admitting: Obstetrics

## 2014-08-12 ENCOUNTER — Encounter: Payer: Self-pay | Admitting: Obstetrics

## 2014-08-12 ENCOUNTER — Other Ambulatory Visit: Payer: Medicaid Other

## 2014-08-12 VITALS — BP 121/74 | HR 96 | Temp 98.2°F | Wt 265.0 lb

## 2014-08-12 DIAGNOSIS — O0993 Supervision of high risk pregnancy, unspecified, third trimester: Secondary | ICD-10-CM

## 2014-08-12 DIAGNOSIS — O09213 Supervision of pregnancy with history of pre-term labor, third trimester: Secondary | ICD-10-CM

## 2014-08-12 LAB — POCT URINALYSIS DIPSTICK
BILIRUBIN UA: NEGATIVE
Blood, UA: NEGATIVE
Glucose, UA: NEGATIVE
Ketones, UA: NEGATIVE
Nitrite, UA: NEGATIVE
PROTEIN UA: NEGATIVE
Spec Grav, UA: 1.01
Urobilinogen, UA: NEGATIVE
pH, UA: 7

## 2014-08-12 NOTE — Progress Notes (Signed)
Subjective:    Alexandra Raymond is a 24 y.o. female being seen today for her obstetrical visit. She is at [redacted]w[redacted]d gestation. Patient reports no complaints. Fetal movement: normal.  Problem List Items Addressed This Visit    None    Visit Diagnoses    Supervision of high risk pregnancy in third trimester    -  Primary    Relevant Orders    POCT urinalysis dipstick (Completed)    Glucose Tolerance, 2 Hours w/1 Hour    CBC    HIV antibody    RPR      Patient Active Problem List   Diagnosis Date Noted  . [redacted] weeks gestation of pregnancy   . Obesity affecting pregnancy, antepartum   . [redacted] weeks gestation of pregnancy   . [redacted] weeks gestation of pregnancy   . History of preterm delivery   . Personal history of pre-term labor   . Encounter for fetal anatomic survey   . [redacted] weeks gestation of pregnancy   . Irregular menstrual cycle 03/03/2013  . PCOS (polycystic ovarian syndrome) 03/03/2013  . Amenorrhea 03/03/2013  . Vaginitis and vulvovaginitis, unspecified 03/03/2013  . Unspecified symptom associated with female genital organs 03/03/2013   Objective:    BP 121/74 mmHg  Pulse 96  Temp(Src) 98.2 F (36.8 C)  Wt 265 lb (120.203 kg)  LMP 01/14/2014 FHT:  150 BPM  Uterine Size: size equals dates  Presentation: unsure     Assessment:    Pregnancy @ [redacted]w[redacted]d weeks   Plan:     labs reviewed, problem list updated Consent signed. GBS sent TDAP offered  Rhogam given for RH negative Pediatrician: discussed. Infant feeding: plans to breastfeed. Maternity leave: discussed. Cigarette smoking: never smoked. Orders Placed This Encounter  Procedures  . Glucose Tolerance, 2 Hours w/1 Hour  . CBC  . HIV antibody  . RPR  . POCT urinalysis dipstick   No orders of the defined types were placed in this encounter.   Follow up in 1 Week.

## 2014-08-13 LAB — CBC
HEMATOCRIT: 36.8 % (ref 36.0–46.0)
Hemoglobin: 12.1 g/dL (ref 12.0–15.0)
MCH: 29.3 pg (ref 26.0–34.0)
MCHC: 32.9 g/dL (ref 30.0–36.0)
MCV: 89.1 fL (ref 78.0–100.0)
MPV: 12.2 fL (ref 8.6–12.4)
Platelets: 232 10*3/uL (ref 150–400)
RBC: 4.13 MIL/uL (ref 3.87–5.11)
RDW: 14.7 % (ref 11.5–15.5)
WBC: 8.6 10*3/uL (ref 4.0–10.5)

## 2014-08-13 LAB — HIV ANTIBODY (ROUTINE TESTING W REFLEX): HIV: NONREACTIVE

## 2014-08-13 LAB — GLUCOSE TOLERANCE, 2 HOURS W/ 1HR
GLUCOSE, 2 HOUR: 77 mg/dL (ref 70–139)
GLUCOSE, FASTING: 69 mg/dL (ref 65–99)
GLUCOSE: 112 mg/dL (ref 70–170)

## 2014-08-13 LAB — RPR

## 2014-08-19 ENCOUNTER — Ambulatory Visit: Payer: Medicaid Other

## 2014-08-19 ENCOUNTER — Ambulatory Visit (INDEPENDENT_AMBULATORY_CARE_PROVIDER_SITE_OTHER): Payer: Medicaid Other | Admitting: Certified Nurse Midwife

## 2014-08-19 VITALS — BP 121/74 | HR 78 | Temp 98.1°F | Wt 268.0 lb

## 2014-08-19 DIAGNOSIS — O0993 Supervision of high risk pregnancy, unspecified, third trimester: Secondary | ICD-10-CM

## 2014-08-19 LAB — POCT URINALYSIS DIPSTICK
Bilirubin, UA: NEGATIVE
Blood, UA: NEGATIVE
Glucose, UA: NORMAL
Ketones, UA: NEGATIVE
LEUKOCYTES UA: NEGATIVE
NITRITE UA: NEGATIVE
PH UA: 7
PROTEIN UA: NEGATIVE
Urobilinogen, UA: NEGATIVE

## 2014-08-19 NOTE — Progress Notes (Signed)
Subjective:    Alexandra Raymond is a 24 y.o. female being seen today for her obstetrical visit. She is at [redacted]w[redacted]d gestation. Patient reports no complaints. Fetal movement: normal.  Problem List Items Addressed This Visit    None    Visit Diagnoses    Supervision of high risk pregnancy in third trimester    -  Primary    Relevant Orders    POCT urinalysis dipstick      Patient Active Problem List   Diagnosis Date Noted  . [redacted] weeks gestation of pregnancy   . Obesity affecting pregnancy, antepartum   . [redacted] weeks gestation of pregnancy   . [redacted] weeks gestation of pregnancy   . History of preterm delivery   . Personal history of pre-term labor   . Encounter for fetal anatomic survey   . [redacted] weeks gestation of pregnancy   . Irregular menstrual cycle 03/03/2013  . PCOS (polycystic ovarian syndrome) 03/03/2013  . Amenorrhea 03/03/2013  . Vaginitis and vulvovaginitis, unspecified 03/03/2013  . Unspecified symptom associated with female genital organs 03/03/2013   Objective:    BP 121/74 mmHg  Pulse 78  Temp(Src) 98.1 F (36.7 C)  Wt 268 lb (121.564 kg)  LMP 01/14/2014 FHT:  145 BPM  Uterine Size: size equals dates  Presentation: cephalic     Assessment:    Pregnancy @ [redacted]w[redacted]d weeks   Plan:     labs reviewed, problem list updated Consent signed. GBS sent TDAP offered  Rhogam given for RH negative Pediatrician: discussed. Infant feeding: plans to breastfeed. Maternity leave: N/a. Cigarette smoking: never smoked. Orders Placed This Encounter  Procedures  . POCT urinalysis dipstick   No orders of the defined types were placed in this encounter.   Follow up in 2 Weeks.

## 2014-08-26 ENCOUNTER — Ambulatory Visit (INDEPENDENT_AMBULATORY_CARE_PROVIDER_SITE_OTHER): Payer: Medicaid Other | Admitting: Obstetrics

## 2014-08-26 ENCOUNTER — Ambulatory Visit: Payer: Medicaid Other

## 2014-08-26 ENCOUNTER — Encounter: Payer: Self-pay | Admitting: Obstetrics

## 2014-08-26 VITALS — BP 120/69 | HR 100 | Temp 98.7°F | Wt 165.8 lb

## 2014-08-26 DIAGNOSIS — O09213 Supervision of pregnancy with history of pre-term labor, third trimester: Secondary | ICD-10-CM

## 2014-08-26 DIAGNOSIS — Z3483 Encounter for supervision of other normal pregnancy, third trimester: Secondary | ICD-10-CM

## 2014-08-26 LAB — POCT URINALYSIS DIPSTICK
Bilirubin, UA: NEGATIVE
Blood, UA: NEGATIVE
Glucose, UA: NEGATIVE
Ketones, UA: NEGATIVE
Nitrite, UA: NEGATIVE
Protein, UA: NEGATIVE
Spec Grav, UA: 1.01
Urobilinogen, UA: NEGATIVE
pH, UA: 6

## 2014-08-26 NOTE — Progress Notes (Signed)
Subjective:    Alexandra Raymond is a 24 y.o. female being seen today for her obstetrical visit. She is at [redacted]w[redacted]d gestation. Patient reports no complaints. Fetal movement: normal.  Problem List Items Addressed This Visit    None    Visit Diagnoses    Encounter for supervision of other normal pregnancy in third trimester    -  Primary    Relevant Orders    POCT urinalysis dipstick (Completed)      Patient Active Problem List   Diagnosis Date Noted  . [redacted] weeks gestation of pregnancy   . Obesity affecting pregnancy, antepartum   . [redacted] weeks gestation of pregnancy   . [redacted] weeks gestation of pregnancy   . History of preterm delivery   . Personal history of pre-term labor   . Encounter for fetal anatomic survey   . [redacted] weeks gestation of pregnancy   . Irregular menstrual cycle 03/03/2013  . PCOS (polycystic ovarian syndrome) 03/03/2013  . Amenorrhea 03/03/2013  . Vaginitis and vulvovaginitis, unspecified 03/03/2013  . Unspecified symptom associated with female genital organs 03/03/2013   Objective:    BP 120/69 mmHg  Pulse 100  Temp(Src) 98.7 F (37.1 C)  Wt 165 lb 12.8 oz (75.206 kg)  LMP 01/14/2014 FHT:  150 BPM  Uterine Size: size greater than dates  Presentation: unsure     Assessment:    Pregnancy @ [redacted]w[redacted]d weeks   Plan:     labs reviewed, problem list updated Consent signed. GBS sent TDAP offered  Rhogam given for RH negative Pediatrician: discussed. Infant feeding: plans to breastfeed. Maternity leave: discussed. Cigarette smoking: never smoked. Orders Placed This Encounter  Procedures  . POCT urinalysis dipstick   No orders of the defined types were placed in this encounter.   Follow up in 1 Week.

## 2014-09-02 ENCOUNTER — Ambulatory Visit (INDEPENDENT_AMBULATORY_CARE_PROVIDER_SITE_OTHER): Payer: Medicaid Other | Admitting: Obstetrics

## 2014-09-02 ENCOUNTER — Ambulatory Visit: Payer: Medicaid Other

## 2014-09-02 VITALS — BP 114/78 | HR 106 | Wt 269.0 lb

## 2014-09-02 DIAGNOSIS — O09212 Supervision of pregnancy with history of pre-term labor, second trimester: Secondary | ICD-10-CM | POA: Diagnosis not present

## 2014-09-02 DIAGNOSIS — O09892 Supervision of other high risk pregnancies, second trimester: Secondary | ICD-10-CM

## 2014-09-02 DIAGNOSIS — O0993 Supervision of high risk pregnancy, unspecified, third trimester: Secondary | ICD-10-CM

## 2014-09-02 NOTE — Progress Notes (Signed)
Pt received 17 p injection at today's visit. Pt tolerated injection well. 

## 2014-09-03 ENCOUNTER — Encounter: Payer: Self-pay | Admitting: Obstetrics

## 2014-09-03 NOTE — Progress Notes (Signed)
Subjective:    Alexandra Raymond is a 24 y.o. female being seen today for her obstetrical visit. She is at [redacted]w[redacted]d gestation. Patient reports no complaints. Fetal movement: normal.  Problem List Items Addressed This Visit    None     Patient Active Problem List   Diagnosis Date Noted  . [redacted] weeks gestation of pregnancy   . Obesity affecting pregnancy, antepartum   . [redacted] weeks gestation of pregnancy   . [redacted] weeks gestation of pregnancy   . History of preterm delivery   . Personal history of pre-term labor   . Encounter for fetal anatomic survey   . [redacted] weeks gestation of pregnancy   . Irregular menstrual cycle 03/03/2013  . PCOS (polycystic ovarian syndrome) 03/03/2013  . Amenorrhea 03/03/2013  . Vaginitis and vulvovaginitis, unspecified 03/03/2013  . Unspecified symptom associated with female genital organs 03/03/2013   Objective:    BP 114/78 mmHg  Pulse 106  Wt 269 lb (122.018 kg)  LMP 01/14/2014 FHT:  150 BPM  Uterine Size: size equals dates  Presentation: unsure     Assessment:    Pregnancy @ [redacted]w[redacted]d weeks   Plan:     labs reviewed, problem list updated Consent signed. GBS sent TDAP offered  Rhogam given for RH negative Pediatrician: discussed. Infant feeding: plans to breastfeed. Maternity leave: discussed. Cigarette smoking: never smoked. No orders of the defined types were placed in this encounter.   No orders of the defined types were placed in this encounter.   Follow up in 2 Weeks.

## 2014-09-09 ENCOUNTER — Ambulatory Visit: Payer: Medicaid Other

## 2014-09-09 ENCOUNTER — Encounter: Payer: Self-pay | Admitting: Obstetrics

## 2014-09-09 ENCOUNTER — Ambulatory Visit (INDEPENDENT_AMBULATORY_CARE_PROVIDER_SITE_OTHER): Payer: Medicaid Other | Admitting: Obstetrics

## 2014-09-09 VITALS — BP 117/77 | HR 105 | Temp 96.3°F | Wt 268.0 lb

## 2014-09-09 DIAGNOSIS — Z8751 Personal history of pre-term labor: Secondary | ICD-10-CM

## 2014-09-09 DIAGNOSIS — O0993 Supervision of high risk pregnancy, unspecified, third trimester: Secondary | ICD-10-CM

## 2014-09-09 NOTE — Progress Notes (Signed)
Subjective:    Alexandra Raymond is a 24 y.o. female being seen today for her obstetrical visit. She is at [redacted]w[redacted]d gestation. Patient reports no complaints. Fetal movement: normal.  Problem List Items Addressed This Visit    None    Visit Diagnoses    Supervision of high risk pregnancy in third trimester    -  Primary    Relevant Orders    POCT urinalysis dipstick      Patient Active Problem List   Diagnosis Date Noted  . [redacted] weeks gestation of pregnancy   . Obesity affecting pregnancy, antepartum   . [redacted] weeks gestation of pregnancy   . [redacted] weeks gestation of pregnancy   . History of preterm delivery   . Personal history of pre-term labor   . Encounter for fetal anatomic survey   . [redacted] weeks gestation of pregnancy   . Irregular menstrual cycle 03/03/2013  . PCOS (polycystic ovarian syndrome) 03/03/2013  . Amenorrhea 03/03/2013  . Vaginitis and vulvovaginitis, unspecified 03/03/2013  . Unspecified symptom associated with female genital organs 03/03/2013   Objective:    BP 117/77 mmHg  Pulse 105  Temp(Src) 96.3 F (35.7 C)  Wt 268 lb (121.564 kg)  LMP 01/14/2014 FHT:  150 BPM  Uterine Size: size equals dates  Presentation: unsure     Assessment:    Pregnancy @ [redacted]w[redacted]d weeks   Plan:     labs reviewed, problem list updated Consent signed. GBS sent TDAP offered  Rhogam given for RH negative Pediatrician: discussed. Infant feeding: plans to breastfeed. Maternity leave: discussed. Cigarette smoking: never smoked. Orders Placed This Encounter  Procedures  . POCT urinalysis dipstick   No orders of the defined types were placed in this encounter.   Follow up in 1 Week.

## 2014-09-15 ENCOUNTER — Ambulatory Visit (INDEPENDENT_AMBULATORY_CARE_PROVIDER_SITE_OTHER): Payer: Medicaid Other | Admitting: Obstetrics

## 2014-09-15 ENCOUNTER — Encounter: Payer: Self-pay | Admitting: Obstetrics

## 2014-09-15 VITALS — BP 123/79 | HR 89 | Temp 98.4°F | Wt 272.8 lb

## 2014-09-15 DIAGNOSIS — O09213 Supervision of pregnancy with history of pre-term labor, third trimester: Secondary | ICD-10-CM | POA: Diagnosis not present

## 2014-09-15 DIAGNOSIS — O0993 Supervision of high risk pregnancy, unspecified, third trimester: Secondary | ICD-10-CM

## 2014-09-15 LAB — POCT URINALYSIS DIPSTICK
Bilirubin, UA: NEGATIVE
Blood, UA: NEGATIVE
Glucose, UA: NEGATIVE
KETONES UA: NEGATIVE
LEUKOCYTES UA: NEGATIVE
Nitrite, UA: NEGATIVE
PH UA: 6
Protein, UA: NEGATIVE
SPEC GRAV UA: 1.015
Urobilinogen, UA: NEGATIVE

## 2014-09-15 MED ORDER — HYDROXYPROGESTERONE CAPROATE 250 MG/ML IM OIL
250.0000 mg | TOPICAL_OIL | INTRAMUSCULAR | Status: AC
  Administered 2014-09-15 – 2014-09-22 (×2): 250 mg via INTRAMUSCULAR

## 2014-09-15 NOTE — Progress Notes (Signed)
Subjective:    Alexandra Raymond is a 24 y.o. female being seen today for her obstetrical visit. She is at [redacted]w[redacted]d gestation. Patient reports no complaints. Fetal movement: normal.  Problem List Items Addressed This Visit    None    Visit Diagnoses    Supervision of high risk pregnancy in third trimester    -  Primary    Relevant Medications    hydroxyprogesterone caproate (DELALUTIN) 250 mg/mL injection 250 mg    Other Relevant Orders    POCT urinalysis dipstick (Completed)    Strep B DNA probe      Patient Active Problem List   Diagnosis Date Noted  . [redacted] weeks gestation of pregnancy   . Obesity affecting pregnancy, antepartum   . [redacted] weeks gestation of pregnancy   . [redacted] weeks gestation of pregnancy   . History of preterm delivery   . Personal history of pre-term labor   . Encounter for fetal anatomic survey   . [redacted] weeks gestation of pregnancy   . Irregular menstrual cycle 03/03/2013  . PCOS (polycystic ovarian syndrome) 03/03/2013  . Amenorrhea 03/03/2013  . Vaginitis and vulvovaginitis, unspecified 03/03/2013  . Unspecified symptom associated with female genital organs 03/03/2013   Objective:    BP 123/79 mmHg  Pulse 89  Temp(Src) 98.4 F (36.9 C)  Wt 272 lb 12.8 oz (123.741 kg)  LMP 01/14/2014 FHT:  150 BPM  Uterine Size: Size > Dates  Presentation: unsure     Assessment:    Pregnancy @ [redacted]w[redacted]d weeks   Plan:     labs reviewed, problem list updated Consent signed. GBS sent TDAP offered  Rhogam given for RH negative Pediatrician: discussed. Infant feeding: plans to breastfeed. Maternity leave: discussed. Cigarette smoking: never smoked. Orders Placed This Encounter  Procedures  . Strep B DNA probe  . POCT urinalysis dipstick   Meds ordered this encounter  Medications  . hydroxyprogesterone caproate (DELALUTIN) 250 mg/mL injection 250 mg    Sig:    Follow up in 1 Week.

## 2014-09-17 LAB — STREP B DNA PROBE: GBSP: DETECTED

## 2014-09-22 ENCOUNTER — Ambulatory Visit (INDEPENDENT_AMBULATORY_CARE_PROVIDER_SITE_OTHER): Payer: Medicaid Other | Admitting: Obstetrics

## 2014-09-22 ENCOUNTER — Encounter: Payer: Self-pay | Admitting: Obstetrics

## 2014-09-22 VITALS — BP 119/76 | HR 96 | Temp 98.3°F | Wt 270.0 lb

## 2014-09-22 DIAGNOSIS — Z3483 Encounter for supervision of other normal pregnancy, third trimester: Secondary | ICD-10-CM

## 2014-09-22 LAB — POCT URINALYSIS DIPSTICK
Bilirubin, UA: NEGATIVE
GLUCOSE UA: NEGATIVE
KETONES UA: NEGATIVE
NITRITE UA: NEGATIVE
Protein, UA: NEGATIVE
Spec Grav, UA: 1.015
Urobilinogen, UA: NEGATIVE
pH, UA: 6.5

## 2014-09-22 NOTE — Progress Notes (Signed)
Subjective:    Alexandra Raymond is a 24 y.o. female being seen today for her obstetrical visit. She is at [redacted]w[redacted]d gestation. Patient reports no complaints. Fetal movement: normal.  Problem List Items Addressed This Visit    None    Visit Diagnoses    Encounter for supervision of other normal pregnancy in third trimester    -  Primary    Relevant Orders    POCT urinalysis dipstick (Completed)      Patient Active Problem List   Diagnosis Date Noted  . [redacted] weeks gestation of pregnancy   . Obesity affecting pregnancy, antepartum   . [redacted] weeks gestation of pregnancy   . [redacted] weeks gestation of pregnancy   . History of preterm delivery   . Personal history of pre-term labor   . Encounter for fetal anatomic survey   . [redacted] weeks gestation of pregnancy   . Irregular menstrual cycle 03/03/2013  . PCOS (polycystic ovarian syndrome) 03/03/2013  . Amenorrhea 03/03/2013  . Vaginitis and vulvovaginitis, unspecified 03/03/2013  . Unspecified symptom associated with female genital organs 03/03/2013   Objective:    BP 119/76 mmHg  Pulse 96  Temp(Src) 98.3 F (36.8 C)  Wt 270 lb (122.471 kg)  LMP 01/14/2014 FHT:  150 BPM  Uterine Size: size greater than dates  Presentation: unsure     Assessment:    Pregnancy @ [redacted]w[redacted]d weeks   Plan:     labs reviewed, problem list updated Consent signed. GBS sent TDAP offered  Rhogam given for RH negative Pediatrician: discussed. Infant feeding: plans to breastfeed. Maternity leave: discussed. Cigarette smoking: never smoked. Orders Placed This Encounter  Procedures  . POCT urinalysis dipstick   No orders of the defined types were placed in this encounter.   Follow up in 1 Week.

## 2014-09-29 ENCOUNTER — Encounter: Payer: Self-pay | Admitting: Obstetrics

## 2014-09-29 ENCOUNTER — Ambulatory Visit (INDEPENDENT_AMBULATORY_CARE_PROVIDER_SITE_OTHER): Payer: Medicaid Other | Admitting: Obstetrics

## 2014-09-29 VITALS — BP 120/81 | HR 102 | Wt 269.0 lb

## 2014-09-29 DIAGNOSIS — Z3493 Encounter for supervision of normal pregnancy, unspecified, third trimester: Secondary | ICD-10-CM

## 2014-09-29 LAB — POCT URINALYSIS DIPSTICK
BILIRUBIN UA: NEGATIVE
Glucose, UA: NEGATIVE
KETONES UA: NEGATIVE
Leukocytes, UA: NEGATIVE
Nitrite, UA: NEGATIVE
PROTEIN UA: NEGATIVE
RBC UA: NEGATIVE
Urobilinogen, UA: NEGATIVE
pH, UA: 7

## 2014-09-29 NOTE — Progress Notes (Signed)
Subjective:    Lukisha Procida is a 24 y.o. female being seen today for her obstetrical visit. She is at [redacted]w[redacted]d gestation. Patient reports no complaints. Fetal movement: normal.  Problem List Items Addressed This Visit    None    Visit Diagnoses    Prenatal care, third trimester    -  Primary    Relevant Orders    POCT urinalysis dipstick (Completed)      Patient Active Problem List   Diagnosis Date Noted  . [redacted] weeks gestation of pregnancy   . Obesity affecting pregnancy, antepartum   . [redacted] weeks gestation of pregnancy   . [redacted] weeks gestation of pregnancy   . History of preterm delivery   . Personal history of pre-term labor   . Encounter for fetal anatomic survey   . [redacted] weeks gestation of pregnancy   . Irregular menstrual cycle 03/03/2013  . PCOS (polycystic ovarian syndrome) 03/03/2013  . Amenorrhea 03/03/2013  . Vaginitis and vulvovaginitis, unspecified 03/03/2013  . Unspecified symptom associated with female genital organs 03/03/2013   Objective:    BP 120/81 mmHg  Pulse 102  Wt 269 lb (122.018 kg)  LMP 01/14/2014 FHT:  150 BPM  Uterine Size: size equals dates  Presentation: unsure     Assessment:    Pregnancy @ [redacted]w[redacted]d weeks   Plan:     labs reviewed, problem list updated Consent signed. GBS sent TDAP offered  Rhogam given for RH negative Pediatrician: discussed.  Subjective:    Neave Lenger is a 24 y.o. female being seen today for her obstetrical visit. She is at [redacted]w[redacted]d gestation. Patient reports no complaints. Fetal movement: normal.  Problem List Items Addressed This Visit    None    Visit Diagnoses    Prenatal care, third trimester    -  Primary    Relevant Orders    POCT urinalysis dipstick (Completed)      Patient Active Problem List   Diagnosis Date Noted  . [redacted] weeks gestation of pregnancy   . Obesity affecting pregnancy, antepartum   . [redacted] weeks gestation of pregnancy   . [redacted] weeks gestation of pregnancy   . History of preterm  delivery   . Personal history of pre-term labor   . Encounter for fetal anatomic survey   . [redacted] weeks gestation of pregnancy   . Irregular menstrual cycle 03/03/2013  . PCOS (polycystic ovarian syndrome) 03/03/2013  . Amenorrhea 03/03/2013  . Vaginitis and vulvovaginitis, unspecified 03/03/2013  . Unspecified symptom associated with female genital organs 03/03/2013   Objective:    BP 120/81 mmHg  Pulse 102  Wt 269 lb (122.018 kg)  LMP 01/14/2014 FHT:  150 BPM  Uterine Size: size greater than dates  Presentation: unsure     Assessment:    Pregnancy @ [redacted]w[redacted]d weeks   Plan:     labs reviewed, problem list updated Consent signed. GBS sent TDAP offered  Rhogam given for RH negative Pediatrician: discussed. Infant feeding: plans to breastfeed. Maternity leave: discussed. Cigarette smoking: never smoked. Orders Placed This Encounter  Procedures  . POCT urinalysis dipstick   No orders of the defined types were placed in this encounter.   Follow up in 1 Week.

## 2014-10-06 ENCOUNTER — Encounter: Payer: Self-pay | Admitting: Obstetrics

## 2014-10-06 ENCOUNTER — Ambulatory Visit (INDEPENDENT_AMBULATORY_CARE_PROVIDER_SITE_OTHER): Payer: Medicaid Other | Admitting: Obstetrics

## 2014-10-06 VITALS — BP 124/80 | HR 95 | Temp 98.7°F | Wt 274.0 lb

## 2014-10-06 DIAGNOSIS — Z3483 Encounter for supervision of other normal pregnancy, third trimester: Secondary | ICD-10-CM

## 2014-10-06 LAB — POCT URINALYSIS DIPSTICK
BILIRUBIN UA: NEGATIVE
GLUCOSE UA: NEGATIVE
Ketones, UA: NEGATIVE
Leukocytes, UA: NEGATIVE
Nitrite, UA: NEGATIVE
Protein, UA: NEGATIVE
RBC UA: NEGATIVE
Urobilinogen, UA: NEGATIVE
pH, UA: 7

## 2014-10-06 NOTE — Progress Notes (Signed)
Subjective:    Alexandra Raymond is a 24 y.o. female being seen today for her obstetrical visit. She is at [redacted]w[redacted]d gestation. Patient reports backache, occasional contractions and pressure. Fetal movement: normal.  Problem List Items Addressed This Visit    None    Visit Diagnoses    Encounter for supervision of other normal pregnancy in third trimester    -  Primary    Relevant Orders    POCT urinalysis dipstick (Completed)      Patient Active Problem List   Diagnosis Date Noted  . [redacted] weeks gestation of pregnancy   . Obesity affecting pregnancy, antepartum   . [redacted] weeks gestation of pregnancy   . [redacted] weeks gestation of pregnancy   . History of preterm delivery   . Personal history of pre-term labor   . Encounter for fetal anatomic survey   . [redacted] weeks gestation of pregnancy   . Irregular menstrual cycle 03/03/2013  . PCOS (polycystic ovarian syndrome) 03/03/2013  . Amenorrhea 03/03/2013  . Vaginitis and vulvovaginitis, unspecified 03/03/2013  . Unspecified symptom associated with female genital organs 03/03/2013    Objective:    BP 124/80 mmHg  Pulse 95  Temp(Src) 98.7 F (37.1 C)  Wt 274 lb (124.286 kg)  LMP 01/14/2014 FHT: 150 BPM  Uterine Size: size equals dates  Presentations: unsure    Assessment:    Pregnancy @ [redacted]w[redacted]d weeks   Plan:   Plans for delivery: Vaginal anticipated; labs reviewed; problem list updated Counseling: Consent signed. Infant feeding: plans to breastfeed. Cigarette smoking: never smoked. L&D discussion: symptoms of labor, discussed when to call, discussed what number to call, anesthetic/analgesic options reviewed and delivering clinician:  plans no preference. Postpartum supports and preparation: circumcision discussed and contraception plans discussed.  Follow up in 1 Week.

## 2014-10-10 ENCOUNTER — Inpatient Hospital Stay (HOSPITAL_COMMUNITY)
Admission: AD | Admit: 2014-10-10 | Discharge: 2014-10-12 | DRG: 775 | Disposition: A | Discharge status: Home / Self Care | Payer: Medicaid Other | Source: Ambulatory Visit | Attending: Obstetrics | Admitting: Obstetrics

## 2014-10-10 ENCOUNTER — Encounter (HOSPITAL_COMMUNITY): Payer: Self-pay

## 2014-10-10 DIAGNOSIS — Z3A38 38 weeks gestation of pregnancy: Secondary | ICD-10-CM | POA: Diagnosis present

## 2014-10-10 DIAGNOSIS — O99824 Streptococcus B carrier state complicating childbirth: Secondary | ICD-10-CM | POA: Diagnosis present

## 2014-10-10 DIAGNOSIS — E282 Polycystic ovarian syndrome: Secondary | ICD-10-CM | POA: Diagnosis present

## 2014-10-10 HISTORY — DX: Personal history of pre-term labor: Z87.51

## 2014-10-10 LAB — CBC
HEMATOCRIT: 38.7 % (ref 36.0–46.0)
HEMOGLOBIN: 12.9 g/dL (ref 12.0–15.0)
MCH: 29 pg (ref 26.0–34.0)
MCHC: 33.3 g/dL (ref 30.0–36.0)
MCV: 87 fL (ref 78.0–100.0)
Platelets: 189 10*3/uL (ref 150–400)
RBC: 4.45 MIL/uL (ref 3.87–5.11)
RDW: 15.5 % (ref 11.5–15.5)
WBC: 14.2 10*3/uL — ABNORMAL HIGH (ref 4.0–10.5)

## 2014-10-10 LAB — TYPE AND SCREEN
ABO/RH(D): A POS
Antibody Screen: NEGATIVE

## 2014-10-10 MED ORDER — OXYTOCIN BOLUS FROM INFUSION
500.0000 mL | INTRAVENOUS | Status: DC
Start: 2014-10-10 — End: 2014-10-10
  Administered 2014-10-10: 500 mL via INTRAVENOUS

## 2014-10-10 MED ORDER — IBUPROFEN 600 MG PO TABS
600.0000 mg | ORAL_TABLET | Freq: Four times a day (QID) | ORAL | Status: DC
Start: 2014-10-10 — End: 2014-10-12
  Administered 2014-10-10 – 2014-10-12 (×8): 600 mg via ORAL
  Filled 2014-10-10 (×8): qty 1

## 2014-10-10 MED ORDER — ACETAMINOPHEN 325 MG PO TABS: 650.0000 mg | ORAL_TABLET | ORAL | Status: DC | PRN

## 2014-10-10 MED ORDER — PENICILLIN G POTASSIUM 5000000 UNITS IJ SOLR
5.0000 10*6.[IU] | Freq: Once | INTRAVENOUS | Status: DC
  Filled 2014-10-10: qty 5

## 2014-10-10 MED ORDER — OXYCODONE-ACETAMINOPHEN 5-325 MG PO TABS: 1.0000 | ORAL_TABLET | ORAL | Status: DC | PRN

## 2014-10-10 MED ORDER — SODIUM CHLORIDE 0.9 % IV SOLN
2.0000 g | Freq: Once | INTRAVENOUS | Status: AC
  Administered 2014-10-10: 2 g via INTRAVENOUS
  Filled 2014-10-10: qty 2000

## 2014-10-10 MED ORDER — LANOLIN HYDROUS EX OINT: TOPICAL_OINTMENT | CUTANEOUS | Status: DC | PRN

## 2014-10-10 MED ORDER — PENICILLIN G POTASSIUM 5000000 UNITS IJ SOLR
2.5000 10*6.[IU] | INTRAVENOUS | Status: DC
  Filled 2014-10-10 (×2): qty 2.5

## 2014-10-10 MED ORDER — BENZOCAINE-MENTHOL 20-0.5 % EX AERO
1.0000 "application " | INHALATION_SPRAY | CUTANEOUS | Status: DC | PRN
  Administered 2014-10-10: 1 via TOPICAL
  Filled 2014-10-10: qty 56

## 2014-10-10 MED ORDER — LIDOCAINE HCL (PF) 1 % IJ SOLN
30.0000 mL | INTRAMUSCULAR | Status: AC | PRN
  Administered 2014-10-10: 30 mL via SUBCUTANEOUS
  Filled 2014-10-10: qty 30

## 2014-10-10 MED ORDER — CITRIC ACID-SODIUM CITRATE 334-500 MG/5ML PO SOLN: 30.0000 mL | ORAL | Status: DC | PRN

## 2014-10-10 MED ORDER — TETANUS-DIPHTH-ACELL PERTUSSIS 5-2.5-18.5 LF-MCG/0.5 IM SUSP
0.5000 mL | Freq: Once | INTRAMUSCULAR | Status: AC
  Administered 2014-10-12: 0.5 mL via INTRAMUSCULAR
  Filled 2014-10-10: qty 0.5

## 2014-10-10 MED ORDER — LACTATED RINGERS IV SOLN: 500.0000 mL | INTRAVENOUS | Status: DC | PRN

## 2014-10-10 MED ORDER — ONDANSETRON HCL 4 MG/2ML IJ SOLN: 4.0000 mg | INTRAMUSCULAR | Status: DC | PRN

## 2014-10-10 MED ORDER — NALBUPHINE HCL 10 MG/ML IJ SOLN
10.0000 mg | Freq: Once | INTRAMUSCULAR | Status: DC
  Filled 2014-10-10: qty 1

## 2014-10-10 MED ORDER — OXYTOCIN 40 UNITS IN LACTATED RINGERS INFUSION - SIMPLE MED
62.5000 mL/h | INTRAVENOUS | Status: DC
  Administered 2014-10-10: 62.5 mL/h via INTRAVENOUS
  Filled 2014-10-10: qty 1000

## 2014-10-10 MED ORDER — ONDANSETRON HCL 4 MG/2ML IJ SOLN: 4.0000 mg | Freq: Four times a day (QID) | INTRAMUSCULAR | Status: DC | PRN

## 2014-10-10 MED ORDER — ONDANSETRON HCL 4 MG PO TABS: 4.0000 mg | ORAL_TABLET | ORAL | Status: DC | PRN

## 2014-10-10 MED ORDER — OXYCODONE-ACETAMINOPHEN 5-325 MG PO TABS: 2.0000 | ORAL_TABLET | ORAL | Status: DC | PRN

## 2014-10-10 MED ORDER — LACTATED RINGERS IV SOLN
INTRAVENOUS | Status: DC
  Administered 2014-10-10: 09:00:00 via INTRAVENOUS

## 2014-10-10 MED ORDER — ZOLPIDEM TARTRATE 5 MG PO TABS: 5.0000 mg | ORAL_TABLET | Freq: Every evening | ORAL | Status: DC | PRN

## 2014-10-10 MED ORDER — SENNOSIDES-DOCUSATE SODIUM 8.6-50 MG PO TABS
2.0000 | ORAL_TABLET | ORAL | Status: DC
  Administered 2014-10-10 – 2014-10-12 (×2): 2 via ORAL
  Filled 2014-10-10 (×2): qty 2

## 2014-10-10 MED ORDER — PROMETHAZINE HCL 25 MG/ML IJ SOLN: 25.0000 mg | Freq: Once | INTRAMUSCULAR | Status: DC

## 2014-10-10 MED ORDER — DIBUCAINE 1 % RE OINT: 1.0000 "application " | TOPICAL_OINTMENT | RECTAL | Status: DC | PRN

## 2014-10-10 MED ORDER — DIPHENHYDRAMINE HCL 25 MG PO CAPS: 25.0000 mg | ORAL_CAPSULE | Freq: Four times a day (QID) | ORAL | Status: DC | PRN

## 2014-10-10 MED ORDER — PRENATAL MULTIVITAMIN CH
1.0000 | ORAL_TABLET | Freq: Every day | ORAL | Status: DC
  Administered 2014-10-11 – 2014-10-12 (×2): 1 via ORAL
  Filled 2014-10-10 (×2): qty 1

## 2014-10-10 MED ORDER — WITCH HAZEL-GLYCERIN EX PADS: 1.0000 "application " | MEDICATED_PAD | CUTANEOUS | Status: DC | PRN

## 2014-10-10 MED ORDER — SIMETHICONE 80 MG PO CHEW: 80.0000 mg | CHEWABLE_TABLET | ORAL | Status: DC | PRN

## 2014-10-10 MED ORDER — OXYTOCIN 40 UNITS IN LACTATED RINGERS INFUSION - SIMPLE MED: 62.5000 mL/h | INTRAVENOUS | Status: DC | PRN

## 2014-10-10 NOTE — Progress Notes (Signed)
Khari Lett is a 24 y.o. G2P0101 at [redacted]w[redacted]d by LMP admitted for active labor  Subjective:   Objective: BP 125/66 mmHg  Pulse 96  Temp(Src) 98.3 F (36.8 C) (Oral)  Resp 18  LMP 01/14/2014      FHT:   UC:   regular, every 3 minutes SVE:   Dilation: 8.5 Effacement (%): 100 Station: -3 Exam by:: A.Davis, RN  Labs: Lab Results  Component Value Date   WBC 8.6 08/12/2014   HGB 12.1 08/12/2014   HCT 36.8 08/12/2014   MCV 89.1 08/12/2014   PLT 232 08/12/2014    Assessment / Plan: Spontaneous labor, progressing normally  Labor: Progressing normally Preeclampsia:  n/a Fetal Wellbeing:  Category I Pain Control:  Labor support without medications I/D:  n/a Anticipated MOD:  NSVD  HARPER,CHARLES A 10/10/2014, 9:33 AM

## 2014-10-10 NOTE — H&P (Signed)
Alexandra Raymond is a 24 y.o. female presenting for contractions and SROM at 0430. Maternal Medical History:  Reason for admission: Rupture of membranes and contractions.   Contractions: Frequency: regular.    Fetal activity: Perceived fetal activity is normal.   Last perceived fetal movement was within the past hour.    Prenatal Complications - Diabetes: none.    OB History    Gravida Para Term Preterm AB TAB SAB Ectopic Multiple Living   Past Medical History  Diagnosis Date  . PCOS (polycystic ovarian syndrome)   . Headache   . History of preterm labor    Past Surgical History  Procedure Laterality Date  . Cystectomy    . Back surgery    . Wisdom tooth extraction     Family History: family history includes Diabetes in her mother. Social History:  reports that she has never smoked. She has never used smokeless tobacco. She reports that she does not drink alcohol or use illicit drugs.   Prenatal Transfer Tool  Maternal Diabetes: No Genetic Screening: Normal Maternal Ultrasounds/Referrals: Normal Fetal Ultrasounds or other Referrals:  None Maternal Substance Abuse:  No Significant Maternal Medications:  None Significant Maternal Lab Results:  Lab values include: Group B Strep positive Other Comments:  None  Review of Systems  All other systems reviewed and are negative.   Dilation: 6 Effacement (%): 100 Station: -3 Exam by:: SBeck, RN/SFaulk, RN Blood pressure 125/66, pulse 96, temperature 98.3 F (36.8 C), temperature source Oral, resp. rate 18, last menstrual period 01/14/2014. Maternal Exam:  Uterine Assessment: Contraction strength is moderate.  Abdomen: Patient reports no abdominal tenderness. Fetal presentation: vertex  Introitus: Normal vulva. Normal vagina.  Cervix: Cervix evaluated by digital exam.     Physical Exam  Nursing note and vitals reviewed. Constitutional: She is oriented to person, place, and time. She appears  well-developed and well-nourished.  HENT:  Head: Normocephalic and atraumatic.  Eyes: Conjunctivae are normal. Pupils are equal, round, and reactive to light.  Neck: Normal range of motion. Neck supple.  Cardiovascular: Normal rate and regular rhythm.   Respiratory: Effort normal and breath sounds normal.  GI: Soft. Bowel sounds are normal.  Genitourinary: Vagina normal and uterus normal.  Musculoskeletal: Normal range of motion.  Neurological: She is alert and oriented to person, place, and time.  Skin: Skin is warm and dry.  Psychiatric: She has a normal mood and affect. Her behavior is normal. Judgment and thought content normal.    Prenatal labs: ABO, Rh: A/POS/-- (03/14 1314) Antibody: NEG (03/14 1314) Rubella: 1.25 (03/14 1314) RPR: NON REAC (07/29 1034)  HBsAg: NEGATIVE (03/14 1314)  HIV: NONREACTIVE (07/29 1034)  GBS: DETECTED (09/01 1143)   Assessment/Plan: 38.3 weeks.  Active labor.  Admit.   HARPER,CHARLES A 10/10/2014, 9:02 AM

## 2014-10-10 NOTE — Lactation Note (Signed)
This note was copied from the chart of Alexandra Brystal Kildow. Lactation Consultation Note  Patient Name: Alexandra Raymond [id number] Date: 10/10/2014 Reason for consult: Initial assessment Mom states that she wants to bf but does not think that she has milk. She has a Hx of pumping for a preterm baby, pumping did not go well for her. Went over milk transition, baby belly size, manual expression, breast care, and feeding frequency. Mom had visitors present that she stated was ok to talk in front of but she did not want to demonstrate manual expression. She was made aware of support group and O/P lactation services.    Maternal Data    Feeding Feeding Type: Bottle Fed - Formula Nipple Type: Slow - flow Length of feed: 0 min  LATCH Score/Interventions Latch: Too sleepy or reluctant, no latch achieved, no sucking elicited.                    Lactation Tools Discussed/Used     Consult Status Consult Status: Follow-up Date: 10/11/14 Follow-up type: In-patient    Alexandra Raymond 10/10/2014, 9:13 PM

## 2014-10-10 NOTE — Progress Notes (Signed)
Notified of pt's cervical exam, gbs +, orders to admit.

## 2014-10-10 NOTE — MAU Note (Signed)
Pt states here for ROM at 0430, clear fluid. Breathing with contractions.

## 2014-10-11 LAB — CBC
HCT: 34.5 % — ABNORMAL LOW (ref 36.0–46.0)
Hemoglobin: 11.3 g/dL — ABNORMAL LOW (ref 12.0–15.0)
MCH: 28.6 pg (ref 26.0–34.0)
MCHC: 32.8 g/dL (ref 30.0–36.0)
MCV: 87.3 fL (ref 78.0–100.0)
Platelets: 180 10*3/uL (ref 150–400)
RBC: 3.95 MIL/uL (ref 3.87–5.11)
RDW: 15.7 % — ABNORMAL HIGH (ref 11.5–15.5)
WBC: 11.6 10*3/uL — ABNORMAL HIGH (ref 4.0–10.5)

## 2014-10-11 LAB — RPR: RPR Ser Ql: NONREACTIVE

## 2014-10-11 NOTE — Progress Notes (Signed)
Post Partum Day #1 Subjective: no complaints, up ad lib, voiding and tolerating PO.  Is currently supplementing breastfeeding after 15 minutes on each side with formula.   Objective: Blood pressure 119/56, pulse 70, temperature 98.8 F (37.1 C), temperature source Oral, resp. rate 18, height  (1.626 m), weight 272 lb (123.378 kg), last menstrual period 01/14/2014, SpO2 99 %, unknown if currently breastfeeding.  Physical Exam:  General: alert, cooperative and no distress Lochia: appropriate Uterine Fundus: firm Incision: healing well DVT Evaluation: No evidence of DVT seen on physical exam. No significant calf/ankle edema.   Recent Labs  10/10/14 0925 10/11/14 0655  HGB 12.9 11.3*  HCT 38.7 34.5*    Assessment/Plan: Plan for discharge tomorrow, Breastfeeding and Lactation consult   LOS: 1 day   Alexandra Raymond 10/11/2014, 9:09 AM

## 2014-10-12 MED ORDER — OXYCODONE-ACETAMINOPHEN 5-325 MG PO TABS: 2.0000 | ORAL_TABLET | ORAL | Status: DC | PRN

## 2014-10-12 MED ORDER — LANOLIN HYDROUS EX OINT: 1.0000 "application " | TOPICAL_OINTMENT | CUTANEOUS | Status: DC | PRN

## 2014-10-12 MED ORDER — WITCH HAZEL-GLYCERIN EX PADS: 1.0000 "application " | MEDICATED_PAD | CUTANEOUS | Status: DC | PRN

## 2014-10-12 MED ORDER — IBUPROFEN 800 MG PO TABS: 800.0000 mg | ORAL_TABLET | Freq: Three times a day (TID) | ORAL | Status: DC | PRN

## 2014-10-12 NOTE — Discharge Instructions (Signed)

## 2014-10-12 NOTE — Discharge Summary (Signed)
Obstetric Discharge Summary Reason for Admission: onset of labor Prenatal Procedures: ultrasound Intrapartum Procedures: spontaneous vaginal delivery Postpartum Procedures: none Complications-Operative and Postpartum: none and 1st degree perineal laceration HEMOGLOBIN  Date Value Ref Range Status  10/11/2014 11.3* 12.0 - 15.0 g/dL Final   HCT  Date Value Ref Range Status  10/11/2014 34.5* 36.0 - 46.0 % Final    Physical Exam:  General: alert, cooperative and no distress Lochia: appropriate Uterine Fundus: firm Incision: healing well DVT Evaluation: No evidence of DVT seen on physical exam. No cords or calf tenderness. No significant calf/ankle edema.  Discharge Diagnoses: Term Pregnancy-delivered  Discharge Information: Date: 10/12/2014 Activity: pelvic rest Diet: routine Medications: PNV, Ibuprofen, Colace and Percocet Condition: stable Instructions: refer to practice specific booklet Discharge to: home   Newborn Data: Live born female  Birth Weight: 7 lb 4.9 oz (3315 g) APGAR: 9, 9  Home with mother.  Alexandra Raymond 10/12/2014, 8:54 AM

## 2014-10-12 NOTE — Lactation Note (Signed)
This note was copied from the chart of Girl Karyssa Amaral. Lactation Consultation Note  Follow up consult prior to D/c with Mom and Alexa. Mom reports she has been giving bottles due to nipple soreness. Mom has soft breast with everted nipples. Positional stripe noted to left nipple and bruising noted to top of right nipple. Mom reported that infant had just fed on right for 15 minutes. Mom says that right side more tolerable than left. Assisted mom with latching infant to left breast. Enc. Mom to use pillows for support and to support infant at all times during feeding, Infant latched readily and needed flanging of lip correction. It was painful to mom until latch seated and lips flanged, she then reported it was much better with no pain noted. Discussed supply and demand and NL NB feeding behavior with mom and importance of frequent BF for supply and engorgement prevention. Engorgement prevention discussed. Gave mom a hand pump with instructions for use. She says she is able to hand express. Gave mom sore nipple breast shells as she says the comfort gels are painful on removal. Enc her to express colostrum post feed and allow to air dry on nipples. Also mentioned that she can use olive oil or coconut oil on nipples between feedings. Enc. Mom to always BF first with formula after if it is needed. Reiterated OP LC services and phone calls, Support Groups, and BF Resources. Enc mom to call with questions/concerns. Baby to be seen by Ped. Tomorrow.   Patient Name: Girl River Mckercher [id number] Date: 10/12/2014 Reason for consult: Follow-up assessment   Maternal Data Does the patient have breastfeeding experience prior to this delivery?: Yes  Feeding Feeding Type: Breast Fed Length of feed: 10 min  LATCH Score/Interventions Latch: Repeated attempts needed to sustain latch, nipple held in mouth throughout feeding, stimulation needed to elicit sucking reflex. Intervention(s): Adjust position;Assist  with latch;Breast massage;Breast compression  Audible Swallowing: Spontaneous and intermittent Intervention(s): Skin to skin;Hand expression  Type of Nipple: Everted at rest and after stimulation  Comfort (Breast/Nipple): Filling, red/small blisters or bruises, mild/mod discomfort  Problem noted: Cracked, bleeding, blisters, bruises (Positional stripe to left nipple, bruising to top of right nipple) Interventions  (Cracked/bleeding/bruising/blister): Expressed breast milk to nipple;Other (comment);Hand pump (Sore Nipple Breast shells given)  Hold (Positioning): Assistance needed to correctly position infant at breast and maintain latch. Intervention(s): Breastfeeding basics reviewed;Support Pillows;Position options;Skin to skin  LATCH Score: 7  Lactation Tools Discussed/Used Pump Review: Setup, frequency, and cleaning;Milk Storage Initiated by:: Noralee Stain, RN, IBCLC Date initiated:: 10/12/14   Consult Status Consult Status: Complete    Silas Flood Hice 10/12/2014, 10:46 AM

## 2014-10-14 ENCOUNTER — Encounter: Payer: Medicaid Other | Admitting: Obstetrics

## 2014-10-24 ENCOUNTER — Ambulatory Visit (INDEPENDENT_AMBULATORY_CARE_PROVIDER_SITE_OTHER): Payer: Medicaid Other | Admitting: Obstetrics

## 2014-10-24 ENCOUNTER — Encounter: Payer: Self-pay | Admitting: Obstetrics

## 2014-10-24 DIAGNOSIS — Z30014 Encounter for initial prescription of intrauterine contraceptive device: Secondary | ICD-10-CM

## 2014-10-24 NOTE — Progress Notes (Signed)
Subjective:     Alexandra Raymond is a 24 y.o. female who presents for a postpartum visit. She is 2 weeks postpartum following a spontaneous vaginal delivery. I have fully reviewed the prenatal and intrapartum course. The delivery was at 38 gestational weeks. Outcome: spontaneous vaginal delivery. Anesthesia: local. Postpartum course has been normal. Baby's course has been normal. Baby is feeding by breast. Bleeding thin lochia. Bowel function is normal. Bladder function is normal. Patient is not sexually active. Contraception method is IUD. Postpartum depression screening: negative.  Tobacco, alcohol and substance abuse history reviewed.  Adult immunizations reviewed including TDAP, rubella and varicella.  The following portions of the patient's history were reviewed and updated as appropriate: allergies, current medications, past family history, past medical history, past social history, past surgical history and problem list.  Review of Systems A comprehensive review of systems was negative.   Objective:    BP 120/84 mmHg  Pulse 73  Temp(Src) 99.1 F (37.3 C)  Wt 259 lb (117.482 kg)  Breastfeeding? Yes    100% of 10 min visit spent on counseling and coordination of care.   Assessment:     Normal postpartum exam. Pap smear not done at today's visit.    Contraceptive counseling and advice  Plan:    1. Contraception: IUD 2. Mirena IUD Rx 3. Follow up in: 6 weeks or as needed.     Healthy lifestyle practices reviewed

## 2014-11-09 ENCOUNTER — Telehealth: Payer: Self-pay | Admitting: *Deleted

## 2014-11-09 NOTE — Telephone Encounter (Signed)
Patient interested in the IUD for contraception. Per Dr. Clearance CootsHarper okay to schedule 6 weeks from her last appointment. Patient scheduled for 12-05-14 @ 10:30 am.

## 2014-12-01 ENCOUNTER — Encounter: Payer: Self-pay | Admitting: Neurology

## 2014-12-01 ENCOUNTER — Ambulatory Visit (INDEPENDENT_AMBULATORY_CARE_PROVIDER_SITE_OTHER): Payer: Medicaid Other | Admitting: Neurology

## 2014-12-01 VITALS — BP 124/84 | HR 86 | Ht 64.0 in | Wt 263.0 lb

## 2014-12-01 DIAGNOSIS — G43009 Migraine without aura, not intractable, without status migrainosus: Secondary | ICD-10-CM

## 2014-12-01 NOTE — Progress Notes (Signed)
Chart forwarded.  

## 2014-12-01 NOTE — Patient Instructions (Signed)
I would probably try the birth control without hormones, because exogenous hormones may be a trigger for migraine.  For some women, it is only estrogen, where you don't take estrogen for a week (in that case continuous hormone therapy would be recommended).  Follow up as needed.

## 2014-12-01 NOTE — Progress Notes (Signed)
NEUROLOGY FOLLOW UP OFFICE NOTE  Alexandra Raymond [id number]  HISTORY OF PRESENT ILLNESS: Alexandra Raymond is a 24 year old right-handed female with PCOS who follows up for migraine without aura.  UPDATE: Migraines had improved during her pregnancy.  She is postpartum following a spontaneous vaginal delivery on 10/10/14.  Since then, she has had only two migraines during the first 2 weeks postpartum.  They were easily treated with Excedrin Migraine.  She is scheduled to have the Mirena IUD replaced next week and was wondering which birth control would be best in regards to migraines.  HISTORY: Onset:  [age] to 24 years old Location:  Bifrontal/retro-orbital Quality:  pressure Initial Intensity:  9-10/10 Aura:  no Prodrome:  no Associated symptoms:  Photophobia.  No nausea, phonophobia, or visual disturbance Initial Duration:  All day (30 minutes with ASA) Initial Frequency:  8 days per month (2-3 days per month severe) up until 6 weeks ago, when she found out she was pregnant. Triggers/exacerbating factors:  none Relieving factors:  Cold rag, rest Activity:  2 or 3 times a month she cannot function.  Past abortive therapy:  ASA (effective), Tylenol, a triptan (effective), Fioricet Past preventative therapy:  none  PAST MEDICAL HISTORY: Past Medical History  Diagnosis Date  . PCOS (polycystic ovarian syndrome)   . Headache   . History of preterm labor     MEDICATIONS: Current Outpatient Prescriptions on File Prior to Visit  Medication Sig Dispense Refill  . ibuprofen (ADVIL,MOTRIN) 800 MG tablet Take 1 tablet (800 mg total) by mouth every 8 (eight) hours as needed. 90 tablet 4  . Omega-3 Fatty Acids (FISH OIL) 1000 MG CAPS Take 1 capsule by mouth daily.     . Prenatal Vit-Fe Fumarate-FA (PNV PRENATAL PLUS MULTIVITAMIN) 27-1 MG TABS take 1 tablet by mouth once daily BEFORE BREAKFAST 30 tablet 11   No current facility-administered medications on file prior to visit.     ALLERGIES: No Known Allergies  FAMILY HISTORY: Family History  Problem Relation Age of Onset  . Diabetes Mother     SOCIAL HISTORY: Social History   Social History  . Marital Status: Married    Spouse Name: N/A  . Number of Children: N/A  . Years of Education: N/A   Occupational History  . Not on file.   Social History Main Topics  . Smoking status: Never Smoker   . Smokeless tobacco: Never Used  . Alcohol Use: No     Comment: Occassionally  . Drug Use: No  . Sexual Activity:    Partners: Male    Birth Control/ Protection: None   Other Topics Concern  . Not on file   Social History Narrative    REVIEW OF SYSTEMS: Constitutional: No fevers, chills, or sweats, no generalized fatigue, change in appetite Eyes: No visual changes, double vision, eye pain Ear, nose and throat: No hearing loss, ear pain, nasal congestion, sore throat Cardiovascular: No chest pain, palpitations Respiratory:  No shortness of breath at rest or with exertion, wheezes GastrointestinaI: No nausea, vomiting, diarrhea, abdominal pain, fecal incontinence Genitourinary:  No dysuria, urinary retention or frequency Musculoskeletal:  No neck pain, back pain Integumentary: No rash, pruritus, skin lesions Neurological: as above Psychiatric: No depression, insomnia, anxiety Endocrine: No palpitations, fatigue, diaphoresis, mood swings, change in appetite, change in weight, increased thirst Hematologic/Lymphatic:  No anemia, purpura, petechiae. Allergic/Immunologic: no itchy/runny eyes, nasal congestion, recent allergic reactions, rashes  PHYSICAL EXAM: Filed Vitals:   12/01/14 0914  BP: 124/84  Pulse: 86  BMI:  45.12 General: No acute distress.  Patient appears well-groomed.   Head:  Normocephalic/atraumatic Eyes:  Fundoscopic exam unremarkable without vessel changes, exudates, hemorrhages or papilledema. Neck: supple, no paraspinal tenderness, full range of motion Heart:  Regular rate  and rhythm Lungs:  Clear to auscultation bilaterally Back: No paraspinal tenderness Neurological Exam: alert and oriented to person, place, and time. Attention span and concentration intact, recent and remote memory intact, fund of knowledge intact.  Speech fluent and not dysarthric, language intact.  CN II-XII intact. Fundoscopic exam unremarkable without vessel changes, exudates, hemorrhages or papilledema.  Bulk and tone normal, muscle strength 5/5 throughout.  Sensation to light touch, temperature and vibration intact.  Deep tendon reflexes 2+ throughout, toes downgoing.  Finger to nose and heel to shin testing intact.  Gait normal.  IMPRESSION: Migraine without aura, stable peri and postpartum  PLAN: We discussed options.  It is really uncertain how she would respond to the Mirena.  She has had headaches all of her life, so she cannot tell if the Mirena was a trigger in the past.  I told her that some women are triggered by migraines with estrogen therapy during the hormone-free interval, so use of continuous estrogen therapy or progestin only contraceptive would be advised.  For other women, any exogenous hormones may trigger migraine.  Therefore, I think it would be worth trying a non-hormonal therapy first, but it is really up to her.  She will contact us with any further questions or concerns.  She is postpartum but, she is to continue weight loss regimen.  She may follow up as needed.  15 minutes spent face to face with patient, over 50% spent discussing contraceptive treatment options and implications.  Shon Millet, DO  CC:  Kirtland Bouchard, MD

## 2014-12-05 ENCOUNTER — Ambulatory Visit (INDEPENDENT_AMBULATORY_CARE_PROVIDER_SITE_OTHER): Payer: Medicaid Other | Admitting: Obstetrics

## 2014-12-05 ENCOUNTER — Encounter: Payer: Self-pay | Admitting: Obstetrics

## 2014-12-05 VITALS — BP 111/78 | HR 79 | Wt 264.0 lb

## 2014-12-05 DIAGNOSIS — Z30430 Encounter for insertion of intrauterine contraceptive device: Secondary | ICD-10-CM | POA: Diagnosis not present

## 2014-12-05 DIAGNOSIS — Z3202 Encounter for pregnancy test, result negative: Secondary | ICD-10-CM

## 2014-12-05 DIAGNOSIS — Z01818 Encounter for other preprocedural examination: Secondary | ICD-10-CM

## 2014-12-05 DIAGNOSIS — Z30014 Encounter for initial prescription of intrauterine contraceptive device: Secondary | ICD-10-CM | POA: Diagnosis not present

## 2014-12-05 LAB — POCT URINE PREGNANCY: PREG TEST UR: NEGATIVE

## 2014-12-05 NOTE — Progress Notes (Signed)
IUD Insertion Procedure Note  Pre-operative Diagnosis: Desire contraception  Post-operative Diagnosis: same  Indications: contraception  Procedure Details  Urine pregnancy test was done in office and result was negative.  The risks (including infection, bleeding, pain, and uterine perforation) and benefits of the procedure were explained to the patient and Written informed consent was obtained.    Cervix cleansed with Betadine. Uterus sounded to 7 cm. IUD inserted without difficulty. String visible and trimmed. Patient tolerated procedure well.  IUD Information: ParaGard, Lot # [id number], Expiration date JAN 2023.  Condition: Stable  Complications: None  Plan:  The patient was advised to call for any fever or for prolonged or severe pain or bleeding. She was advised to use NSAID as needed for mild to moderate pain.   Attending Physician Documentation: I was present for or participated in the entire procedure, including opening and closing.

## 2014-12-10 LAB — SURESWAB, VAGINOSIS/VAGINITIS PLUS
Atopobium vaginae: NOT DETECTED Log (cells/mL)
C. ALBICANS, DNA: NOT DETECTED
C. GLABRATA, DNA: NOT DETECTED
C. PARAPSILOSIS, DNA: DETECTED — AB
C. TRACHOMATIS RNA, TMA: NOT DETECTED
C. TROPICALIS, DNA: NOT DETECTED
Gardnerella vaginalis: <4.7 Log (cells/mL)
LACTOBACILLUS SPECIES: NOT DETECTED Log (cells/mL)
MEGASPHAERA SPECIES: NOT DETECTED Log (cells/mL)
N. GONORRHOEAE RNA, TMA: NOT DETECTED
T. VAGINALIS RNA, QL TMA: NOT DETECTED

## 2014-12-11 ENCOUNTER — Other Ambulatory Visit: Payer: Self-pay | Admitting: Obstetrics

## 2014-12-11 DIAGNOSIS — B3731 Acute candidiasis of vulva and vagina: Secondary | ICD-10-CM

## 2014-12-11 DIAGNOSIS — B373 Candidiasis of vulva and vagina: Secondary | ICD-10-CM

## 2014-12-11 MED ORDER — FLUCONAZOLE 150 MG PO TABS: 150.0000 mg | ORAL_TABLET | Freq: Once | ORAL | Status: DC

## 2015-01-17 ENCOUNTER — Encounter: Payer: Self-pay | Admitting: Obstetrics

## 2015-01-17 ENCOUNTER — Ambulatory Visit (INDEPENDENT_AMBULATORY_CARE_PROVIDER_SITE_OTHER): Payer: Medicaid Other | Admitting: Obstetrics

## 2015-01-17 VITALS — BP 129/81 | HR 84 | Temp 98.4°F | Wt 266.0 lb

## 2015-01-17 DIAGNOSIS — Z975 Presence of (intrauterine) contraceptive device: Secondary | ICD-10-CM

## 2015-01-17 DIAGNOSIS — Z30431 Encounter for routine checking of intrauterine contraceptive device: Secondary | ICD-10-CM | POA: Diagnosis not present

## 2015-01-17 NOTE — Progress Notes (Signed)
Subjective:    Alexandra Raymond is a 25 y.o. female who presents for 6 week follow up after ParaGuard IUD Insertion. The patient has no complaints today. The patient is sexually active. Pertinent past medical history: none.  The information documented in the HPI was reviewed and verified.  Menstrual History: OB History    Gravida Para Term Preterm AB TAB SAB Ectopic Multiple Living   2 2 1 1      0 2       Patient's last menstrual period was 12/25/2014.   Patient Active Problem List   Diagnosis Date Noted  . Labor and delivery indication for care or intervention 10/10/2014  . NSVD (normal spontaneous vaginal delivery) 10/10/2014  . [redacted] weeks gestation of pregnancy   . Obesity affecting pregnancy, antepartum   . [redacted] weeks gestation of pregnancy   . [redacted] weeks gestation of pregnancy   . History of preterm delivery   . Personal history of pre-term labor   . Encounter for fetal anatomic survey   . [redacted] weeks gestation of pregnancy   . Irregular menstrual cycle 03/03/2013  . PCOS (polycystic ovarian syndrome) 03/03/2013  . Amenorrhea 03/03/2013  . Vaginitis and vulvovaginitis, unspecified 03/03/2013  . Unspecified symptom associated with female genital organs 03/03/2013   Past Medical History  Diagnosis Date  . PCOS (polycystic ovarian syndrome)   . Headache   . History of preterm labor     Past Surgical History  Procedure Laterality Date  . Cystectomy    . Back surgery    . Wisdom tooth extraction       Current outpatient prescriptions:  .  fluconazole (DIFLUCAN) 150 MG tablet, Take 1 tablet (150 mg total) by mouth once. (Patient not taking: Reported on 01/17/2015), Disp: 1 tablet, Rfl: 2 .  ibuprofen (ADVIL,MOTRIN) 800 MG tablet, Take 1 tablet (800 mg total) by mouth every 8 (eight) hours as needed. (Patient not taking: Reported on 12/05/2014), Disp: 90 tablet, Rfl: 4 .  metFORMIN (GLUCOPHAGE-XR) 500 MG 24 hr tablet, Reported on 01/17/2015, Disp: , Rfl: 8 .  Omega-3 Fatty Acids  (FISH OIL) 1000 MG CAPS, Take 1 capsule by mouth daily. Reported on 01/17/2015, Disp: , Rfl:  .  Prenatal Vit-Fe Fumarate-FA (PNV PRENATAL PLUS MULTIVITAMIN) 27-1 MG TABS, take 1 tablet by mouth once daily BEFORE BREAKFAST (Patient not taking: Reported on 01/17/2015), Disp: 30 tablet, Rfl: 11 No Known Allergies  Social History  Substance Use Topics  . Smoking status: Never Smoker   . Smokeless tobacco: Never Used  . Alcohol Use: No     Comment: Occassionally    Family History  Problem Relation Age of Onset  . Diabetes Mother        Review of Systems Constitutional: negative for weight loss Genitourinary:negative for abnormal menstrual periods and vaginal discharge   Objective:   BP 129/81 mmHg  Pulse 84  Temp(Src) 98.4 F (36.9 C)  Wt 266 lb (120.657 kg)  LMP 12/25/2014           General:  Alert and no distress Abdomen:  normal findings: no organomegaly, soft, non-tender and no hernia  Pelvis:  External genitalia: normal general appearance Urinary system: urethral meatus normal and bladder without fullness, nontender Vaginal: normal without tenderness, induration or masses Cervix: normal appearance.  IUD string visible and normal length Adnexa: normal bimanual exam Uterus: anteverted and non-tender, normal size   Lab Review Urine pregnancy test Labs reviewed yes Radiologic studies reviewed no    Assessment:  25 y.o., continuing IUD, no contraindications.   Plan:    All questions answered. Discussed healthy lifestyle modifications. Agricultural engineer distributed. Follow up in 2 months. Wet prep.  Ordered.  No orders of the defined types were placed in this encounter.   Orders Placed This Encounter  Procedures  . SureSwab, Vaginosis/Vaginitis Plus

## 2015-01-19 LAB — SURESWAB, VAGINOSIS/VAGINITIS PLUS
Atopobium vaginae: NOT DETECTED Log (cells/mL)
C. GLABRATA, DNA: NOT DETECTED
C. albicans, DNA: NOT DETECTED
C. parapsilosis, DNA: NOT DETECTED
C. trachomatis RNA, TMA: NOT DETECTED
C. tropicalis, DNA: NOT DETECTED
LACTOBACILLUS SPECIES: NOT DETECTED Log (cells/mL)
MEGASPHAERA SPECIES: NOT DETECTED Log (cells/mL)
N. gonorrhoeae RNA, TMA: NOT DETECTED
T. vaginalis RNA, QL TMA: NOT DETECTED

## 2015-02-06 ENCOUNTER — Ambulatory Visit: Payer: Medicaid Other | Admitting: Obstetrics

## 2015-03-21 ENCOUNTER — Ambulatory Visit (INDEPENDENT_AMBULATORY_CARE_PROVIDER_SITE_OTHER): Payer: Medicaid Other | Admitting: Obstetrics

## 2015-03-21 ENCOUNTER — Encounter: Payer: Self-pay | Admitting: Obstetrics

## 2015-03-21 VITALS — BP 118/85 | HR 86 | Wt 252.0 lb

## 2015-03-21 DIAGNOSIS — Z975 Presence of (intrauterine) contraceptive device: Secondary | ICD-10-CM

## 2015-03-21 DIAGNOSIS — Z01419 Encounter for gynecological examination (general) (routine) without abnormal findings: Secondary | ICD-10-CM

## 2015-03-21 DIAGNOSIS — Z0000 Encounter for general adult medical examination without abnormal findings: Secondary | ICD-10-CM | POA: Diagnosis not present

## 2015-03-21 MED ORDER — PNV PRENATAL PLUS MULTIVITAMIN 27-1 MG PO TABS: ORAL_TABLET | ORAL | Status: DC

## 2015-03-21 NOTE — Progress Notes (Signed)
Subjective:        Alexandra Raymond is a 25 y.o. female here for a routine exam.  Current complaints: None.    Personal health questionnaire:  Is patient Ashkenazi Jewish, have a family history of breast and/or ovarian cancer: no Is there a family history of uterine cancer diagnosed at age < 22, gastrointestinal cancer, urinary tract cancer, family member who is a Personnel officer syndrome-associated carrier: no Is the patient overweight and hypertensive, family history of diabetes, personal history of gestational diabetes, preeclampsia or PCOS: no Is patient over 59, have PCOS,  family history of premature CHD under age 29, diabetes, smoke, have hypertension or peripheral artery disease:  no At any time, has a partner hit, kicked or otherwise hurt or frightened you?: no Over the past 2 weeks, have you felt down, depressed or hopeless?: no Over the past 2 weeks, have you felt little interest or pleasure in doing things?:no   Gynecologic History No LMP recorded. Contraception: IUD Last Pap: 2016. Results were: normal Last mammogram: n/a. Results were: n/a  Obstetric History OB History  Gravida Para Term Preterm AB SAB TAB Ectopic Multiple Living  0 2    # Outcome Date GA Lbr Len/2nd Weight Sex Delivery Anes PTL Lv  2 Term 10/10/14 [redacted]w[redacted]d 03:42 / 00:10 7 lb 4.9 oz (3.315 kg) F Vag-Spont Local  Y  1 Preterm 10/09/08 [redacted]w[redacted]d  3 lb 1 oz (1.389 kg) M Vag-Spont None Y Y      Past Medical History  Diagnosis Date  . PCOS (polycystic ovarian syndrome)   . Headache   . History of preterm labor     Past Surgical History  Procedure Laterality Date  . Cystectomy    . Back surgery    . Wisdom tooth extraction       Current outpatient prescriptions:  .  Omega-3 Fatty Acids (FISH OIL) 1000 MG CAPS, Take 1 capsule by mouth daily. Reported on 03/21/2015, Disp: , Rfl:  .  Prenatal Vit-Fe Fumarate-FA (PNV PRENATAL PLUS MULTIVITAMIN) 27-1 MG TABS, take 1 tablet by mouth once daily BEFORE  BREAKFAST, Disp: 30 tablet, Rfl: 11 No Known Allergies  Social History  Substance Use Topics  . Smoking status: Never Smoker   . Smokeless tobacco: Never Used  . Alcohol Use: No     Comment: Occassionally    Family History  Problem Relation Age of Onset  . Diabetes Mother       Review of Systems  Constitutional: negative for fatigue and weight loss Respiratory: negative for cough and wheezing Cardiovascular: negative for chest pain, fatigue and palpitations Gastrointestinal: negative for abdominal pain and change in bowel habits Musculoskeletal:negative for myalgias Neurological: negative for gait problems and tremors Behavioral/Psych: negative for abusive relationship, depression Endocrine: negative for temperature intolerance   Genitourinary:negative for abnormal menstrual periods, genital lesions, hot flashes, sexual problems and vaginal discharge Integument/breast: negative for breast lump, breast tenderness, nipple discharge and skin lesion(s)    Objective:       BP 118/85 mmHg  Pulse 86  Wt 252 lb (114.306 kg)  LMP  General:   alert  Skin:   no rash or abnormalities  Lungs:   clear to auscultation bilaterally  Heart:   regular rate and rhythm, S1, S2 normal, no murmur, click, rub or gallop  Breasts:   normal without suspicious masses, skin or nipple changes or axillary nodes  Abdomen:  normal findings: no organomegaly, soft, non-tender and  no hernia  Pelvis:  External genitalia: normal general appearance Urinary system: urethral meatus normal and bladder without fullness, nontender Vaginal: normal without tenderness, induration or masses Cervix: normal appearance Adnexa: normal bimanual exam Uterus: anteverted and non-tender, normal size   Lab Review Urine pregnancy test Labs reviewed yes Radiologic studies reviewed no    Assessment:    Healthy female exam.    Contraceptive management.  Pleased with ParaGuard IUD   Plan:    Continue ParaGuard IUD   Education reviewed: depression evaluation, low fat, low cholesterol diet, self breast exams and weight bearing exercise. Follow up in: 1 year.   Meds ordered this encounter  Medications  . Prenatal Vit-Fe Fumarate-FA (PNV PRENATAL PLUS MULTIVITAMIN) 27-1 MG TABS    Sig: take 1 tablet by mouth once daily BEFORE BREAKFAST    Dispense:  30 tablet    Refill:  11   Orders Placed This Encounter  Procedures  . SureSwab, Vaginosis/Vaginitis Plus

## 2015-03-22 LAB — PAP IG W/ RFLX HPV ASCU

## 2015-03-24 LAB — SURESWAB, VAGINOSIS/VAGINITIS PLUS
Atopobium vaginae: NOT DETECTED Log (cells/mL)
C. PARAPSILOSIS, DNA: NOT DETECTED
C. TROPICALIS, DNA: NOT DETECTED
C. albicans, DNA: NOT DETECTED
C. glabrata, DNA: NOT DETECTED
C. trachomatis RNA, TMA: NOT DETECTED
LACTOBACILLUS SPECIES: NOT DETECTED Log (cells/mL)
MEGASPHAERA SPECIES: NOT DETECTED Log (cells/mL)
N. gonorrhoeae RNA, TMA: NOT DETECTED
T. vaginalis RNA, QL TMA: NOT DETECTED

## 2015-06-02 ENCOUNTER — Ambulatory Visit: Payer: Medicaid Other

## 2016-07-24 IMAGING — US US OB DETAIL+14 WK
1 series · 12 of 28 positions shown · non-contrast
Comparison: none

[Series 1: us ob detail+14 wk · 12 of 57 slices shown]
[im 3/57]
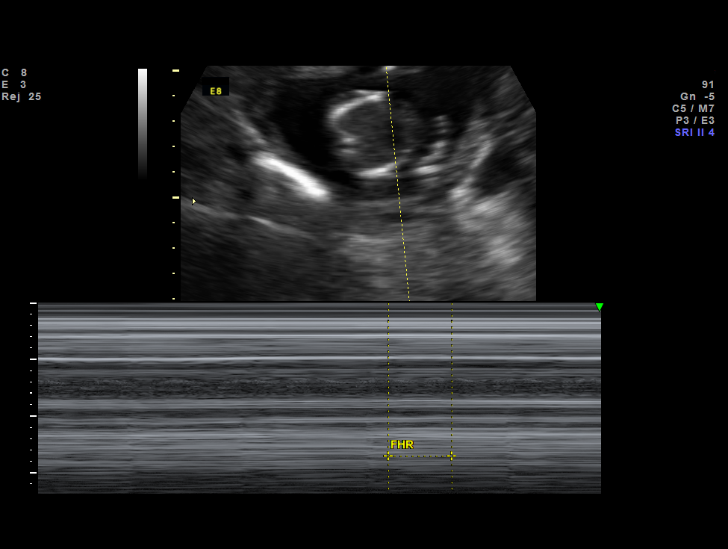
[im 7/57]
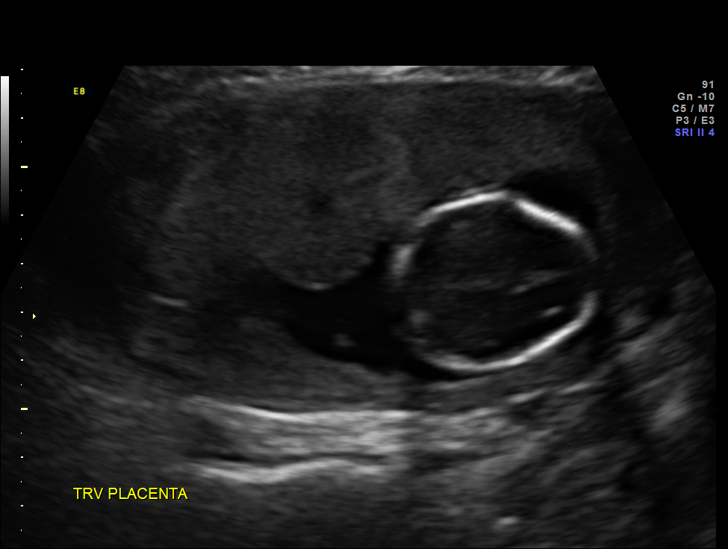
[im 11/57]
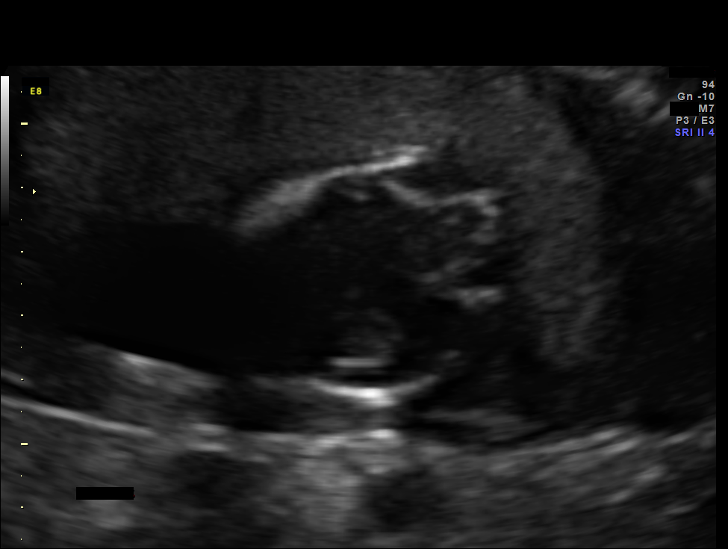
[im 17/57]
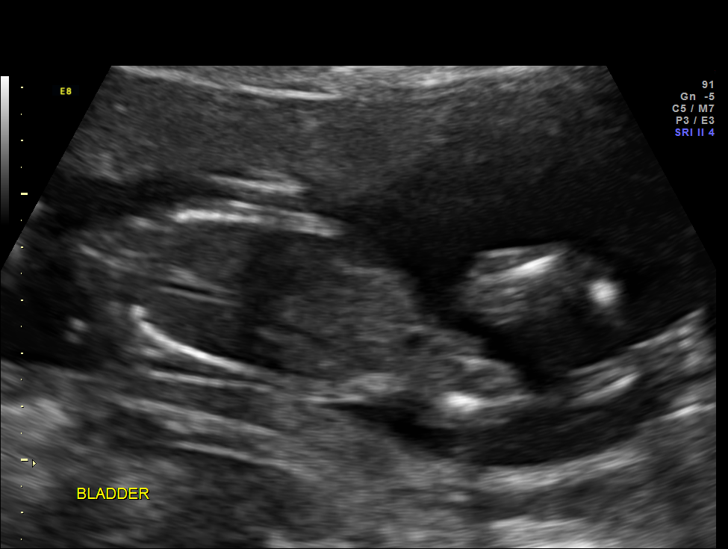
[im 21/57]
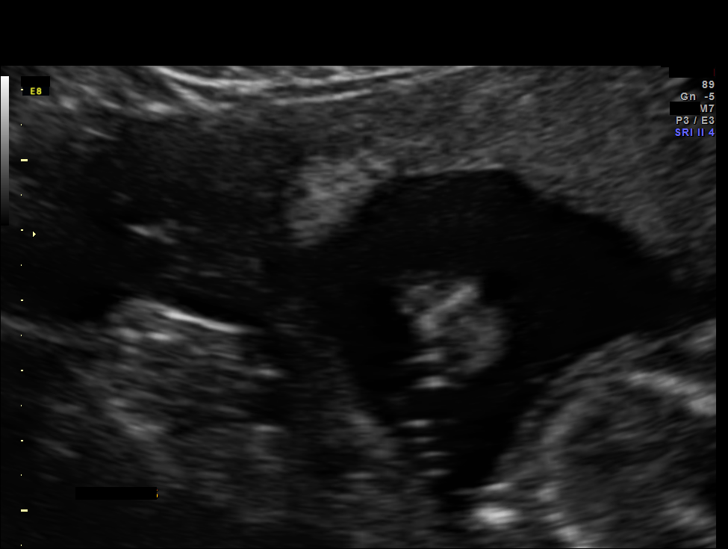
[im 25/57]
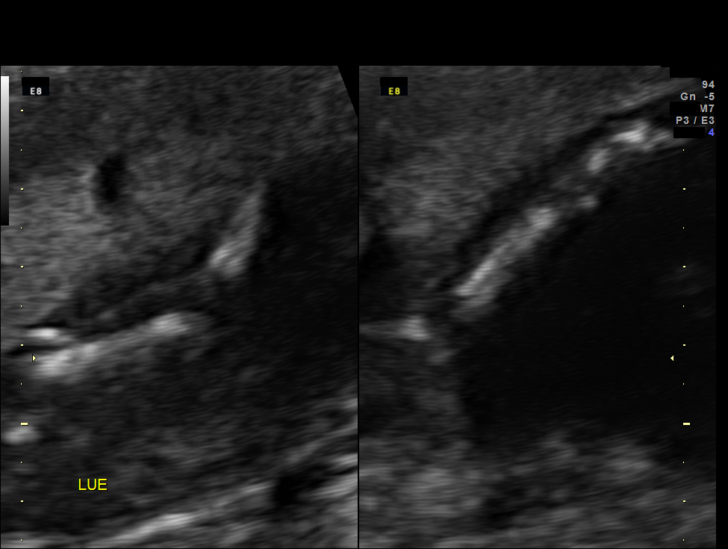
[im 32/57]
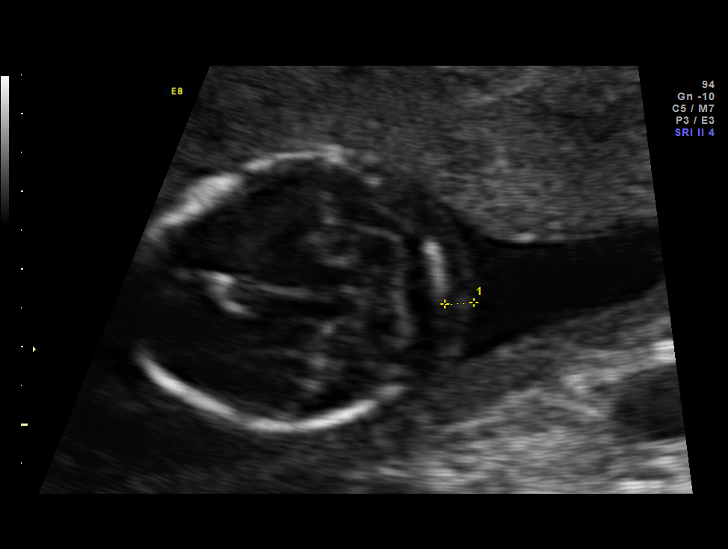
[im 36/57]
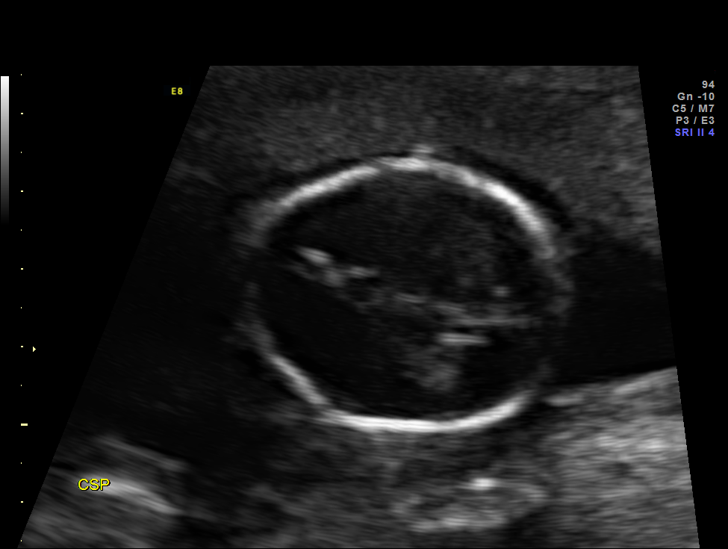
[im 40/57]
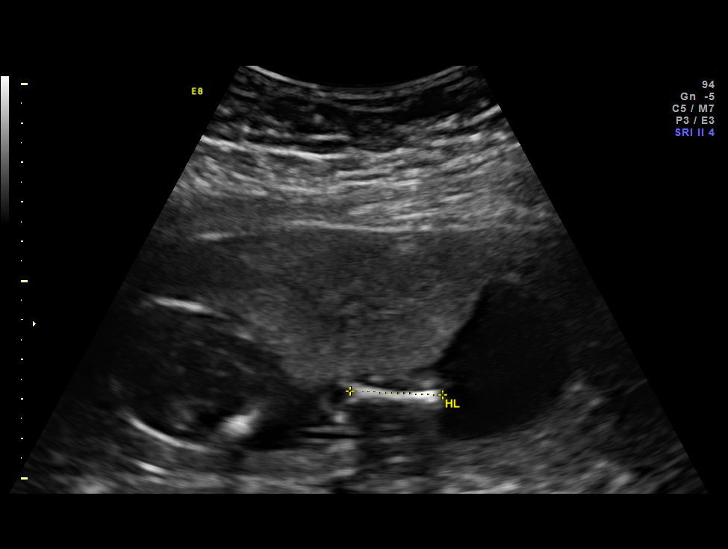
[im 46/57]
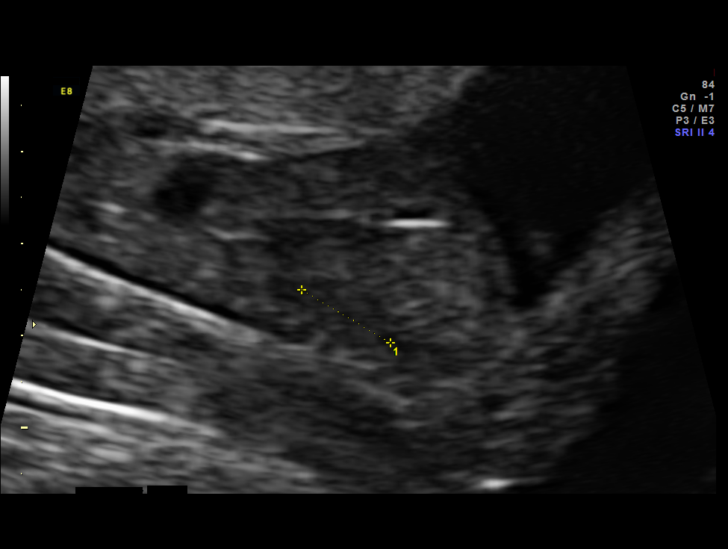
[im 50/57]
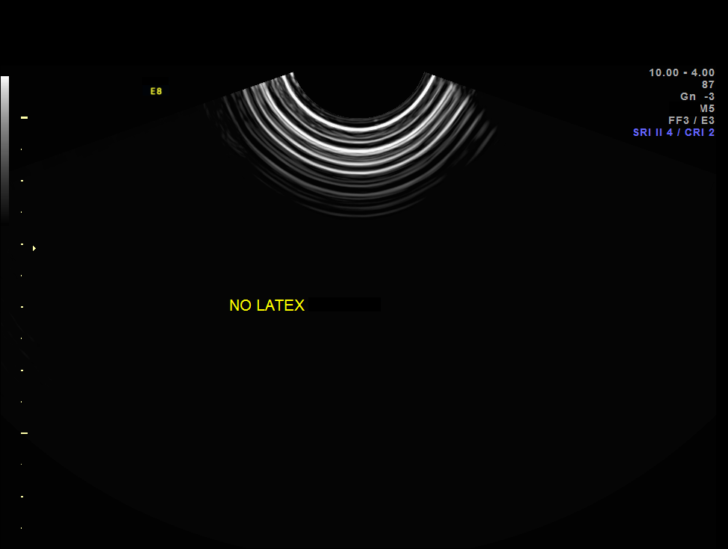
[im 54/57]
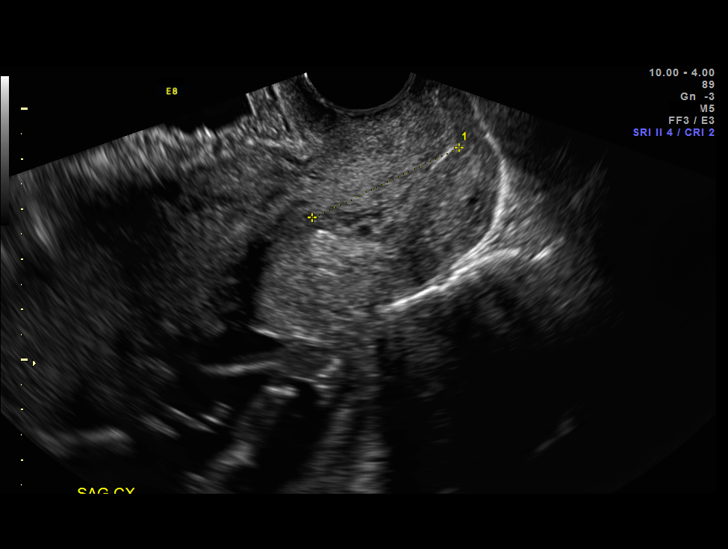

[12 of 28 positions shown; findings below may reference images not displayed]

OBSTETRICS REPORT
(Signed Final 05/12/2014 [DATE])

Service(s) Provided

US OB DETAIL + 14 WK                                  76811.0
US MFM OB TRANSVAGINAL                                76817.2
Indications

Poor obstetric history: Previous preterm delivery
(29 weeks)
Detailed fetal anatomic survey                        Z36
16 weeks gestation of pregnancy
Fetal Evaluation

Num Of Fetuses:    1
Fetal Heart Rate:  159                          bpm
Cardiac Activity:  Observed
Presentation:      Breech
Placenta:          Anterior, above cervical os
P. Cord            Visualized, central
Insertion:

Amniotic Fluid
AFI FV:      Subjectively within normal limits
Larg Pckt:     3.3  cm
Biometry

BPD:       35  mm     G. Age:  16w 6d                CI:         82.4   70 - 86
OFD:     42.5  mm                                    FL/HC:      18.2   14.6 -
17.6
HC:     123.5  mm     G. Age:  16w 1d       11  %    HC/AC:      1.15   1.07 -
1.29
AC:     107.7  mm     G. Age:  16w 5d       43  %    FL/BPD:
FL:      22.5  mm     G. Age:  16w 5d       41  %    FL/AC:      20.9   20 - 24
HUM:     22.8  mm     G. Age:  17w 0d       60  %
CER:     17.1  mm     G. Age:  17w 1d       55  %

Est. FW:     165  gm      0 lb 6 oz     53  %
Gestational Age

LMP:           16w 6d        Date:  01/14/14                 EDD:   10/21/14
U/S Today:     16w 4d                                        EDD:   10/23/14
Best:          16w 6d     Det. By:  LMP  (01/14/14)          EDD:   10/21/14
Anatomy
Cranium:          Appears normal         Aortic Arch:      Not well visualized
Fetal Cavum:      Appears normal         Ductal Arch:      Not well visualized
Ventricles:       Appears normal         Diaphragm:        Not well visualized
Choroid Plexus:   Appears normal         Stomach:          Appears normal, left
sided
Cerebellum:       Appears normal         Abdomen:          Appears normal
Posterior Fossa:  Appears normal         Abdominal Wall:   Appears nml (cord
insert, abd wall)
Nuchal Fold:      Appears normal         Cord Vessels:     Appears normal (3
vessel cord)
Face:             Appears normal         Kidneys:          Not well visualized
(orbits and profile)
Lips:             Appears normal         Bladder:          Appears normal
Heart:            Not well visualized    Spine:            Not well visualized
RVOT:             Not well visualized    Lower             Appears normal
Extremities:
LVOT:             Not well visualized    Upper             Appears normal
Extremities:

Other:  Fetus appears to be a female. Parents do not wish to know sex of
fetus. Heels visualized. Nasal bone visualized. Technically difficult
due to early gestational age.
Cervix Uterus Adnexa

Cervical Length:    3.6      cm
Impression

Single living intrauterine pregnancy at 16 weeks 6 days.
Appropriate fetal growth (53%).
Normal amniotic fluid volume.
No gross fetal anomalies identified.
Normal transvaginal cervical length of 3.6 cm.
Recommendations

Recommend follow-up ultrasound examination in 2 weeks to
reassess cervical length.
Recommend follow-up ultrasound examination in 4 weeks to
reassess fetal growth.

## 2016-12-25 DIAGNOSIS — B9689 Other specified bacterial agents as the cause of diseases classified elsewhere: Secondary | ICD-10-CM | POA: Diagnosis not present

## 2016-12-25 DIAGNOSIS — N898 Other specified noninflammatory disorders of vagina: Secondary | ICD-10-CM | POA: Diagnosis not present

## 2016-12-25 DIAGNOSIS — N760 Acute vaginitis: Secondary | ICD-10-CM | POA: Diagnosis not present

## 2017-08-18 DIAGNOSIS — L255 Unspecified contact dermatitis due to plants, except food: Secondary | ICD-10-CM | POA: Diagnosis not present

## 2017-08-18 DIAGNOSIS — H6122 Impacted cerumen, left ear: Secondary | ICD-10-CM | POA: Diagnosis not present

## 2018-01-27 ENCOUNTER — Encounter (HOSPITAL_COMMUNITY): Payer: Self-pay

## 2018-01-27 ENCOUNTER — Emergency Department (HOSPITAL_COMMUNITY)
Admission: EM | Admit: 2018-01-27 | Discharge: 2018-01-28 | Disposition: A | Discharge status: Home / Self Care | Payer: Medicaid Other | Attending: Emergency Medicine | Admitting: Emergency Medicine

## 2018-01-27 DIAGNOSIS — R109 Unspecified abdominal pain: Secondary | ICD-10-CM | POA: Diagnosis not present

## 2018-01-27 DIAGNOSIS — R197 Diarrhea, unspecified: Secondary | ICD-10-CM | POA: Diagnosis not present

## 2018-01-27 DIAGNOSIS — R1032 Left lower quadrant pain: Secondary | ICD-10-CM | POA: Diagnosis not present

## 2018-01-27 DIAGNOSIS — Z79899 Other long term (current) drug therapy: Secondary | ICD-10-CM | POA: Insufficient documentation

## 2018-01-27 LAB — COMPREHENSIVE METABOLIC PANEL
ALT: 29 U/L (ref 0–44)
AST: 22 U/L (ref 15–41)
Albumin: 3.8 g/dL (ref 3.5–5.0)
Alkaline Phosphatase: 85 U/L (ref 38–126)
Anion gap: 9 (ref 5–15)
BUN: 11 mg/dL (ref 6–20)
CHLORIDE: 105 mmol/L (ref 98–111)
CO2: 24 mmol/L (ref 22–32)
Calcium: 9.2 mg/dL (ref 8.9–10.3)
Creatinine, Ser: 0.58 mg/dL (ref 0.44–1.00)
GFR calc non Af Amer: >60 mL/min (ref >60)
Glucose, Bld: 109 mg/dL — ABNORMAL HIGH (ref 70–99)
Potassium: 3.8 mmol/L (ref 3.5–5.1)
Sodium: 138 mmol/L (ref 135–145)
Total Bilirubin: 0.4 mg/dL (ref 0.3–1.2)
Total Protein: 7.5 g/dL (ref 6.5–8.1)

## 2018-01-27 LAB — CBC
HCT: 37.4 % (ref 36.0–46.0)
Hemoglobin: 11.8 g/dL — ABNORMAL LOW (ref 12.0–15.0)
MCH: 27.1 pg (ref 26.0–34.0)
MCHC: 31.6 g/dL (ref 30.0–36.0)
MCV: 86 fL (ref 80.0–100.0)
Platelets: 218 10*3/uL (ref 150–400)
RBC: 4.35 MIL/uL (ref 3.87–5.11)
RDW: 15 % (ref 11.5–15.5)
WBC: 12.8 10*3/uL — AB (ref 4.0–10.5)
nRBC: 0 % (ref 0.0–0.2)

## 2018-01-27 LAB — URINALYSIS, ROUTINE W REFLEX MICROSCOPIC
Bilirubin Urine: NEGATIVE
Glucose, UA: NEGATIVE mg/dL
Hgb urine dipstick: NEGATIVE
Ketones, ur: NEGATIVE mg/dL
LEUKOCYTES UA: NEGATIVE
NITRITE: NEGATIVE
Protein, ur: NEGATIVE mg/dL
Specific Gravity, Urine: 1.003 — ABNORMAL LOW (ref 1.005–1.030)
pH: 7 (ref 5.0–8.0)

## 2018-01-27 LAB — I-STAT BETA HCG BLOOD, ED (MC, WL, AP ONLY)

## 2018-01-27 LAB — LIPASE, BLOOD: LIPASE: 27 U/L (ref 11–51)

## 2018-01-27 NOTE — ED Triage Notes (Signed)
Pt states that for the past 30 minutes she has been having vaginal pain and lower abd pain, dysuria, denies discharge.

## 2018-01-28 ENCOUNTER — Emergency Department (HOSPITAL_COMMUNITY): Payer: Medicaid Other

## 2018-01-28 DIAGNOSIS — R1032 Left lower quadrant pain: Secondary | ICD-10-CM | POA: Diagnosis not present

## 2018-01-28 NOTE — ED Provider Notes (Signed)
[facility] Provider Note  CSN: [id number] Arrival date & time: 01/27/18 2233  Chief Complaint(s) Vaginal Pain and Abdominal Pain  HPI Alexandra Raymond is a 28 y.o. female    Abdominal Pain  Pain location:  Suprapubic Pain quality: pressure and sharp   Pain radiation: to vaginal. Pain severity now: mild to severe. Onset quality:  Gradual Duration:  6 hours Timing:  Constant Progression:  Waxing and waning Chronicity:  New Context: not previous surgeries, not recent sexual activity, not sick contacts, not suspicious food intake and not trauma   Relieved by:  Nothing Worsened by:  Nothing Associated symptoms: diarrhea (once)   Associated symptoms: no chills, no constipation, no dysuria, no fever, no hematochezia, no nausea, no vaginal bleeding, no vaginal discharge and no vomiting     Past Medical History Past Medical History:  Diagnosis Date  . Headache   . History of preterm labor   . PCOS (polycystic ovarian syndrome)    Patient Active Problem List   Diagnosis Date Noted  . Labor and delivery indication for care or intervention 10/10/2014  . NSVD (normal spontaneous vaginal delivery) 10/10/2014  . [redacted] weeks gestation of pregnancy   . Obesity affecting pregnancy, antepartum   . [redacted] weeks gestation of pregnancy   . [redacted] weeks gestation of pregnancy   . History of preterm delivery   . Personal history of pre-term labor   . Encounter for fetal anatomic survey   . [redacted] weeks gestation of pregnancy   . Irregular menstrual cycle 03/03/2013  . PCOS (polycystic ovarian syndrome) 03/03/2013  . Amenorrhea 03/03/2013  . Vaginitis and vulvovaginitis, unspecified 03/03/2013  . Unspecified symptom associated with female genital organs 03/03/2013   Home Medication(s) Prior to Admission medications   Medication Sig Start Date End Date Taking? Authorizing Provider  Omega-3 Fatty Acids (FISH OIL) 1000 MG CAPS Take 1 capsule by mouth daily.  Reported on 03/21/2015    [provider]  Prenatal Vit-Fe Fumarate-FA (PNV PRENATAL PLUS MULTIVITAMIN) 27-1 MG TABS take 1 tablet by mouth once daily BEFORE BREAKFAST 03/21/15   Brock BadHarper, Charles A, MD                                                                                                                                    Past Surgical History Past Surgical History:  Procedure Laterality Date  . BACK SURGERY    . cystectomy    . WISDOM TOOTH EXTRACTION     Family History Family History  Problem Relation Age of Onset  . Diabetes Mother     Social History Social History   Tobacco Use  . Smoking status: Never Smoker  . Smokeless tobacco: Never Used  Substance Use Topics  . Alcohol use: No    Alcohol/week: 0.0 standard drinks    Comment: Occassionally  . Drug use: No   Allergies Patient has no known allergies.  Review  of Systems Review of Systems  Constitutional: Negative for chills and fever.  Gastrointestinal: Positive for abdominal pain and diarrhea (once). Negative for constipation, hematochezia, nausea and vomiting.  Genitourinary: Negative for dysuria, vaginal bleeding and vaginal discharge.   All other systems are reviewed and are negative for acute change except as noted in the HPI  Physical Exam Vital Signs  I have reviewed the triage vital signs BP (!) 138/97   Pulse (!) 104   Temp 99.2 F (37.3 C) (Oral)   Resp 20   SpO2 100%   Physical Exam Vitals signs reviewed.  Constitutional:      General: She is not in acute distress.    Appearance: She is well-developed. She is not diaphoretic.  HENT:     Head: Normocephalic and atraumatic.     Right Ear: External ear normal.     Left Ear: External ear normal.     Nose: Nose normal.  Eyes:     General: No scleral icterus.    Conjunctiva/sclera: Conjunctivae normal.  Neck:     Musculoskeletal: Normal range of motion.     Trachea: Phonation normal.  Cardiovascular:     Rate and Rhythm: Normal  rate and regular rhythm.  Pulmonary:     Effort: Pulmonary effort is normal. No respiratory distress.     Breath sounds: No stridor.  Abdominal:     General: There is no distension.     Tenderness: There is abdominal tenderness in the left lower quadrant.    Musculoskeletal: Normal range of motion.  Neurological:     Mental Status: She is alert and oriented to person, place, and time.  Psychiatric:        Behavior: Behavior normal.     ED Results and Treatments Labs (all labs ordered are listed, but only abnormal results are displayed) Labs Reviewed  COMPREHENSIVE METABOLIC PANEL - Abnormal; Notable for the following components:      Result Value   Glucose, Bld 109 (*)    All other components within normal limits  CBC - Abnormal; Notable for the following components:   WBC 12.8 (*)    Hemoglobin 11.8 (*)    All other components within normal limits  URINALYSIS, ROUTINE W REFLEX MICROSCOPIC - Abnormal; Notable for the following components:   Color, Urine STRAW (*)    Specific Gravity, Urine 1.003 (*)    All other components within normal limits  LIPASE, BLOOD  I-STAT BETA HCG BLOOD, ED ([facility], [facility], [facility] ONLY)                                                                                                                         EKG  EKG Interpretation  Date/Time:    Ventricular Rate:    PR Interval:    QRS Duration:   QT Interval:    QTC Calculation:   R Axis:     Text Interpretation:        Radiology US Transvaginal Non-ob  Result Date: 01/28/2018 CLINICAL DATA:  Left lower quadrant pain EXAM: TRANSABDOMINAL AND TRANSVAGINAL ULTRASOUND OF PELVIS DOPPLER ULTRASOUND OF OVARIES TECHNIQUE: Both transabdominal and transvaginal ultrasound examinations of the pelvis were performed. Transabdominal technique was performed for global imaging of the pelvis including uterus, ovaries, adnexal regions, and pelvic cul-de-sac. It was necessary to proceed with endovaginal exam following  the transabdominal exam to visualize the endometrium. Color and duplex Doppler ultrasound was utilized to evaluate blood flow to the ovaries. COMPARISON:  01/28/2014 FINDINGS: Uterus Measurements: 8 x 5 x 6 cm = volume: 113 mL. No fibroids or other mass visualized. Endometrium Thickness: 1 cm. IUD is present with low side bar projecting 3 cm below the fundic endometrial cavity. No visible myometrial penetration. Right ovary Measurements: 41 x 30 x 27 mm = volume: 18 mL. Normal appearance/no adnexal mass. Left ovary Measurements: 29 x 27 x 19 mm = volume: 8 mL. Normal appearance/no adnexal mass. Pulsed Doppler evaluation of both ovaries demonstrates normal low-resistance arterial and venous waveforms. Other findings No abnormal free fluid. IMPRESSION: 1. No acute finding. Normal appearance of the ovaries, including blood flow. 2. Low IUD, crossbar 3 cm below the fundic endometrium. Electronically Signed   By: Marnee Spring M.D.   On: 01/28/2018 04:20   US Pelvis Complete  Result Date: 01/28/2018 CLINICAL DATA:  Left lower quadrant pain EXAM: TRANSABDOMINAL AND TRANSVAGINAL ULTRASOUND OF PELVIS DOPPLER ULTRASOUND OF OVARIES TECHNIQUE: Both transabdominal and transvaginal ultrasound examinations of the pelvis were performed. Transabdominal technique was performed for global imaging of the pelvis including uterus, ovaries, adnexal regions, and pelvic cul-de-sac. It was necessary to proceed with endovaginal exam following the transabdominal exam to visualize the endometrium. Color and duplex Doppler ultrasound was utilized to evaluate blood flow to the ovaries. COMPARISON:  01/28/2014 FINDINGS: Uterus Measurements: 8 x 5 x 6 cm = volume: 113 mL. No fibroids or other mass visualized. Endometrium Thickness: 1 cm. IUD is present with low side bar projecting 3 cm below the fundic endometrial cavity. No visible myometrial penetration. Right ovary Measurements: 41 x 30 x 27 mm = volume: 18 mL. Normal appearance/no  adnexal mass. Left ovary Measurements: 29 x 27 x 19 mm = volume: 8 mL. Normal appearance/no adnexal mass. Pulsed Doppler evaluation of both ovaries demonstrates normal low-resistance arterial and venous waveforms. Other findings No abnormal free fluid. IMPRESSION: 1. No acute finding. Normal appearance of the ovaries, including blood flow. 2. Low IUD, crossbar 3 cm below the fundic endometrium. Electronically Signed   By: Marnee Spring M.D.   On: 01/28/2018 04:20   [place] Art/ven Flow Abd Pelv Doppler  Result Date: 01/28/2018 CLINICAL DATA:  Left lower quadrant pain EXAM: TRANSABDOMINAL AND TRANSVAGINAL ULTRASOUND OF PELVIS DOPPLER ULTRASOUND OF OVARIES TECHNIQUE: Both transabdominal and transvaginal ultrasound examinations of the pelvis were performed. Transabdominal technique was performed for global imaging of the pelvis including uterus, ovaries, adnexal regions, and pelvic cul-de-sac. It was necessary to proceed with endovaginal exam following the transabdominal exam to visualize the endometrium. Color and duplex Doppler ultrasound was utilized to evaluate blood flow to the ovaries. COMPARISON:  01/28/2014 FINDINGS: Uterus Measurements: 8 x 5 x 6 cm = volume: 113 mL. No fibroids or other mass visualized. Endometrium Thickness: 1 cm. IUD is present with low side bar projecting 3 cm below the fundic endometrial cavity. No visible myometrial penetration. Right ovary Measurements: 41 x 30 x 27 mm = volume: 18 mL. Normal appearance/no adnexal mass. Left ovary Measurements: 29 x 27 x 19  mm = volume: 8 mL. Normal appearance/no adnexal mass. Pulsed Doppler evaluation of both ovaries demonstrates normal low-resistance arterial and venous waveforms. Other findings No abnormal free fluid. IMPRESSION: 1. No acute finding. Normal appearance of the ovaries, including blood flow. 2. Low IUD, crossbar 3 cm below the fundic endometrium. Electronically Signed   By: Marnee SpringJonathon  Watts M.D.   On: 01/28/2018 04:20   Pertinent labs  & imaging results that were available during my care of the patient were reviewed by me and considered in my medical decision making (see chart for details).  Medications Ordered in ED Medications - No data to display                                                                                                                                  Procedures Procedures  (including critical care time)  Medical Decision Making / ED Course I have reviewed the nursing notes for this encounter and the patient's prior records (if available in EHR or on provided paperwork).    Patient with suprapubic and left lower quadrant abdominal pain and discomfort.  Patient has tenderness over the inguinal canal region.  No obvious hernia noted.  Labs with mild leukocytosis without significant anemia.  No electrolyte derangements or renal insufficiency.  Beta hCG negative.  UA without evidence of infection.  Given her history of PCOS, ultrasound was obtained and ruled out ovarian torsion.  Patient reported that her symptoms had completely resolved.  Repeat abdominal exam was benign.  Doubt serious intra-abdominal inflammatory or infectious process requiring further imaging.  The patient appears reasonably screened and/or stabilized for discharge and I doubt any other medical condition or other [facility] requiring further screening, evaluation, or treatment in the ED at this time prior to discharge.  The patient is safe for discharge with strict return precautions.   Final Clinical Impression(s) / ED Diagnoses Final diagnoses:  Abdominal cramping   Disposition: Discharge  Condition: Good  I have discussed the results, Dx and Tx plan with the patient who expressed understanding and agree(s) with the plan. Discharge instructions discussed at great length. The patient was given strict return precautions who verbalized understanding of the instructions. No further questions at time of discharge.    ED Discharge  Orders    None       Follow Up: Primary care provider  Schedule an appointment as soon as possible for a visit        This chart was dictated using voice recognition software.  Despite best efforts to proofread,  errors can occur which can change the documentation meaning.   Nira Connardama, Candid Bovey Eduardo, MD 01/28/18 [phone]

## 2018-01-28 NOTE — ED Notes (Signed)
Patient transported to Ultrasound 

## 2018-01-28 NOTE — ED Notes (Signed)
Reviewed d/c instructions with pt, who verbalized understanding and had no outstanding questions. Pt departed in NAD, refused use of wheelchair.   

## 2018-01-28 NOTE — Discharge Instructions (Addendum)

## 2018-10-15 ENCOUNTER — Other Ambulatory Visit: Payer: Self-pay

## 2018-10-15 ENCOUNTER — Other Ambulatory Visit (HOSPITAL_COMMUNITY)
Admission: RE | Admit: 2018-10-15 | Discharge: 2018-10-15 | Disposition: A | Discharge status: Home / Self Care | Payer: Medicaid Other | Source: Ambulatory Visit | Attending: Obstetrics | Admitting: Obstetrics

## 2018-10-15 ENCOUNTER — Encounter: Payer: Self-pay | Admitting: Obstetrics

## 2018-10-15 ENCOUNTER — Ambulatory Visit: Payer: Medicaid Other | Admitting: Obstetrics

## 2018-10-15 VITALS — BP 133/90 | HR 92 | Ht 64.0 in | Wt 277.8 lb

## 2018-10-15 DIAGNOSIS — Z3202 Encounter for pregnancy test, result negative: Secondary | ICD-10-CM

## 2018-10-15 DIAGNOSIS — E282 Polycystic ovarian syndrome: Secondary | ICD-10-CM

## 2018-10-15 DIAGNOSIS — Z01419 Encounter for gynecological examination (general) (routine) without abnormal findings: Secondary | ICD-10-CM

## 2018-10-15 DIAGNOSIS — Z0000 Encounter for general adult medical examination without abnormal findings: Secondary | ICD-10-CM

## 2018-10-15 DIAGNOSIS — Z30431 Encounter for routine checking of intrauterine contraceptive device: Secondary | ICD-10-CM

## 2018-10-15 DIAGNOSIS — R102 Pelvic and perineal pain: Secondary | ICD-10-CM

## 2018-10-15 LAB — POCT URINE PREGNANCY: Preg Test, Ur: NEGATIVE

## 2018-10-15 MED ORDER — IBUPROFEN 800 MG PO TABS: 800.0000 mg | ORAL_TABLET | Freq: Three times a day (TID) | ORAL | 5 refills | Status: DC | PRN

## 2018-10-15 NOTE — Progress Notes (Signed)
Subjective:        Alexandra Raymond is a 28 y.o. female here for a routine exam.  Current complaints: pelvic pain for the past 2 months.  The pain radiates to her pack.    Personal health questionnaire:  Is patient Ashkenazi Jewish, have a family history of breast and/or ovarian cancer: no Is there a family history of uterine cancer diagnosed at age < 6, gastrointestinal cancer, urinary tract cancer, family member who is a Personnel officer syndrome-associated carrier: yes Is the patient overweight and hypertensive, family history of diabetes, personal history of gestational diabetes, preeclampsia or PCOS: yes Is patient over 69, have PCOS,  family history of premature CHD under age 43, diabetes, smoke, have hypertension or peripheral artery disease:  no At any time, has a partner hit, kicked or otherwise hurt or frightened you?: no Over the past 2 weeks, have you felt down, depressed or hopeless?: no Over the past 2 weeks, have you felt little interest or pleasure in doing things?:no   Gynecologic History Patient's last menstrual period was 10/08/2018 (exact date). Contraception: IUD ( ParaGuard ) Lastt Pap: 03-21-2015. Results were: normal Last mammogram: n/a. Results were: n/a  Obstetric History OB History  Gravida Para Term Preterm AB Living  2 2 1 1   2   SAB TAB Ectopic Multiple Live Births        0 2    # Outcome Date GA Lbr Len/2nd Weight Sex Delivery Anes PTL Lv  2 Term 10/10/14 [redacted]w[redacted]d 03:42 / 00:10 7 lb 4.9 oz (3.315 kg) F Vag-Spont Local  LIV  1 Preterm 10/09/08 [redacted]w[redacted]d  3 lb 1 oz (1.389 kg) M Vag-Spont None Y LIV    Past Medical History:  Diagnosis Date  . Headache   . History of preterm labor   . PCOS (polycystic ovarian syndrome)     Past Surgical History:  Procedure Laterality Date  . BACK SURGERY    . cystectomy    . WISDOM TOOTH EXTRACTION       Current Outpatient Medications:  .  ibuprofen (ADVIL) 800 MG tablet, Take 1 tablet (800 mg total) by mouth every 8  (eight) hours as needed., Disp: 30 tablet, Rfl: 5 .  Omega-3 Fatty Acids (FISH OIL) 1000 MG CAPS, Take 1 capsule by mouth daily. Reported on 03/21/2015, Disp: , Rfl:  .  Prenatal Vit-Fe Fumarate-FA (PNV PRENATAL PLUS MULTIVITAMIN) 27-1 MG TABS, take 1 tablet by mouth once daily BEFORE BREAKFAST (Patient not taking: Reported on 10/15/2018), Disp: 30 tablet, Rfl: 11 No Known Allergies  Social History   Tobacco Use  . Smoking status: Never Smoker  . Smokeless tobacco: Never Used  Substance Use Topics  . Alcohol use: No    Alcohol/week: 0.0 standard drinks    Comment: Occassionally    Family History  Problem Relation Age of Onset  . Diabetes Mother       Review of Systems  Constitutional: negative for fatigue and weight loss Respiratory: negative for cough and wheezing Cardiovascular: negative for chest pain, fatigue and palpitations Gastrointestinal: negative for abdominal pain and change in bowel habits Musculoskeletal:negative for myalgias Neurological: negative for gait problems and tremors Behavioral/Psych: negative for abusive relationship, depression Endocrine: negative for temperature intolerance    Genitourinary:negative for abnormal menstrual periods, genital lesions, hot flashes, sexual problems and vaginal discharge Integument/breast: negative for breast lump, breast tenderness, nipple discharge and skin lesion(s)    Objective:       BP 133/90   Pulse 92  Ht 5\' 4"  (1.626 m)   Wt 277 lb 12.8 oz (126 kg)   LMP 10/08/2018 (Exact Date)   Breastfeeding No   BMI 47.68 kg/m  General:   alert  Skin:   no rash or abnormalities  Lungs:   clear to auscultation bilaterally  Heart:   regular rate and rhythm, S1, S2 normal, no murmur, click, rub or gallop  Breasts:   normal without suspicious masses, skin or nipple changes or axillary nodes  Abdomen:  normal findings: no organomegaly, soft, non-tender and no hernia  Pelvis:  External genitalia: normal general  appearance Urinary system: urethral meatus normal and bladder without fullness, nontender Vaginal: normal without tenderness, induration or masses Cervix: normal appearance Adnexa: normal bimanual exam Uterus: anteverted and non-tender, normal size   Lab Review Urine pregnancy test Labs reviewed yes Radiologic studies reviewed no  50% of 25 min visit spent on counseling and coordination of care.   Assessment:     1. Encounter for routine gynecological examination with Papanicolaou smear of cervix Rx: - Cytology - PAP( [place])  2. Pelvic pain Rx: - POCT urine pregnancy - US PELVIC COMPLETE WITH TRANSVAGINAL; Future - ibuprofen (ADVIL) 800 MG tablet; Take 1 tablet (800 mg total) by mouth every 8 (eight) hours as needed.  Dispense: 30 tablet; Refill: 5  3. Class 3 severe obesity due to excess calories without serious comorbidity with body mass index (BMI) of 45.0 to 49.9 in adult [facility]) - program of caloric restriction, exercise and behavioral modification recommended  4. PCOS (polycystic ovarian syndrome)  5. Encounter for management of intrauterine contraceptive device (IUD), unspecified IUD management type - pleased with ParaGuard that was inserted in 2016    Plan:    Education reviewed: calcium supplements, depression evaluation, low fat, low cholesterol diet, safe sex/STD prevention, self breast exams and weight bearing exercise. Contraception: IUD. Follow up in: 3 months. Ultrasound ordered to R/O Ovarian Cyst   Meds ordered this encounter  Medications  . ibuprofen (ADVIL) 800 MG tablet    Sig: Take 1 tablet (800 mg total) by mouth every 8 (eight) hours as needed.    Dispense:  30 tablet    Refill:  5   Orders Placed This Encounter  Procedures  . US PELVIC COMPLETE WITH TRANSVAGINAL    Standing Status:   Future    Standing Expiration Date:   12/15/2019    Order Specific Question:   Reason for Exam (SYMPTOM  OR DIAGNOSIS REQUIRED)    Answer:   Pelvic pain     Order Specific Question:   Preferred imaging location?    Answer:   [facility] Outpatient Ultrasound  . POCT urine pregnancy    Shelly Bombard, MD 10/15/2018 3:25 PM

## 2018-10-15 NOTE — Progress Notes (Signed)
Pt is here for annual exam. Last pap 2017, normal. Paragard IUD since 2016.

## 2018-10-20 LAB — CYTOLOGY - PAP: Diagnosis: NEGATIVE

## 2018-10-22 ENCOUNTER — Ambulatory Visit (HOSPITAL_COMMUNITY)
Admission: RE | Admit: 2018-10-22 | Discharge: 2018-10-22 | Disposition: A | Discharge status: Home / Self Care | Payer: Medicaid Other | Source: Ambulatory Visit | Attending: Obstetrics | Admitting: Obstetrics

## 2018-10-22 ENCOUNTER — Other Ambulatory Visit: Payer: Self-pay

## 2018-10-22 DIAGNOSIS — R102 Pelvic and perineal pain: Secondary | ICD-10-CM | POA: Diagnosis not present

## 2018-11-03 ENCOUNTER — Ambulatory Visit: Payer: Medicaid Other | Admitting: Obstetrics

## 2018-11-03 ENCOUNTER — Encounter: Payer: Self-pay | Admitting: Obstetrics

## 2018-11-03 ENCOUNTER — Other Ambulatory Visit: Payer: Self-pay

## 2018-11-03 VITALS — BP 132/83 | HR 82 | Wt 283.0 lb

## 2018-11-03 DIAGNOSIS — Z30011 Encounter for initial prescription of contraceptive pills: Secondary | ICD-10-CM

## 2018-11-03 DIAGNOSIS — Z30432 Encounter for removal of intrauterine contraceptive device: Secondary | ICD-10-CM | POA: Diagnosis not present

## 2018-11-03 DIAGNOSIS — Z3009 Encounter for other general counseling and advice on contraception: Secondary | ICD-10-CM

## 2018-11-03 MED ORDER — BALCOLTRA 0.1-20 MG-MCG(21) PO TABS: 1.0000 | ORAL_TABLET | Freq: Every day | ORAL | 11 refills | Status: DC

## 2018-11-03 NOTE — Progress Notes (Signed)
    GYNECOLOGY OFFICE PROCEDURE NOTE  Alexandra Raymond is a 28 y.o. [id number] here for [place] IUD removal. No GYN concerns.  Last pap smear was on 10-15-2018 and was normal.  IUD Removal  Patient identified, informed consent performed, consent signed.  Patient was in the dorsal lithotomy position, normal external genitalia was noted.  A speculum was placed in the patient's vagina, normal discharge was noted, no lesions. The cervix was visualized, no lesions, no abnormal discharge.  The strings of the IUD were grasped and pulled using ring forceps. The IUD was removed in its entirety.   Patient tolerated the procedure well.    Patient will use OCP's for contraception, take PNV and folic acid.  Routine preventative health maintenance measures emphasized.   Shelly Bombard, MD, [place] [facility] [place] [facility] 11/03/2018

## 2019-01-15 NOTE — L&D Delivery Note (Signed)
Delivery Note Called to bedside and patient complete and pushing. At 10:32 AM a viable female was delivered via Vaginal, Spontaneous (Presentation:   Occiput Anterior).  APGAR:9,9; weight pending 3399g.   Placenta status: Spontaneous, Intact.  Cord: 3 vessels with the following complications: None.  Anesthesia: None Episiotomy: None Lacerations: None Est. Blood Loss (mL): 50  Mom to postpartum.  Baby to Couplet care / Skin to Skin.  Alric Seton 12/09/2019, 11:08 AM

## 2019-04-21 ENCOUNTER — Ambulatory Visit (INDEPENDENT_AMBULATORY_CARE_PROVIDER_SITE_OTHER): Payer: Medicaid Other

## 2019-04-21 DIAGNOSIS — Z3201 Encounter for pregnancy test, result positive: Secondary | ICD-10-CM

## 2019-04-21 DIAGNOSIS — Z3490 Encounter for supervision of normal pregnancy, unspecified, unspecified trimester: Secondary | ICD-10-CM | POA: Insufficient documentation

## 2019-04-21 DIAGNOSIS — O0990 Supervision of high risk pregnancy, unspecified, unspecified trimester: Secondary | ICD-10-CM | POA: Insufficient documentation

## 2019-04-21 DIAGNOSIS — N912 Amenorrhea, unspecified: Secondary | ICD-10-CM

## 2019-04-21 LAB — POCT URINE PREGNANCY: Preg Test, Ur: POSITIVE — AB

## 2019-04-21 MED ORDER — BLOOD PRESSURE MONITOR KIT: 1.0000 | PACK | 0 refills | Status: DC

## 2019-04-21 NOTE — Progress Notes (Signed)
Ms. Hazelett presents today for UPT. She has no unusual complaints Breast Tenderness. Round Ligament pain. Marland Kitchen LMP: 03/10/19    OBJECTIVE: Appears well, in no apparent distress.  OB History    Gravida  2   Para  2   Term  1   Preterm  1   AB      Living  2     SAB      TAB      Ectopic      Multiple  0   Live Births  2          Home UPT Result: x 2 Both were Positive  In-Office UPT result: Positive  I have reviewed the patient's medical, obstetrical, social, and family histories, and medications.   ASSESSMENT: Positive pregnancy test  PLAN Prenatal care to be completed at: [facility]  B/P Cuff sent  Enroll Babyscripts

## 2019-04-22 DIAGNOSIS — Z3490 Encounter for supervision of normal pregnancy, unspecified, unspecified trimester: Secondary | ICD-10-CM | POA: Diagnosis not present

## 2019-05-06 ENCOUNTER — Other Ambulatory Visit: Payer: Self-pay

## 2019-05-06 DIAGNOSIS — R110 Nausea: Secondary | ICD-10-CM

## 2019-05-06 MED ORDER — PROMETHAZINE HCL 25 MG PO TABS: 25.0000 mg | ORAL_TABLET | Freq: Four times a day (QID) | ORAL | 0 refills | Status: DC | PRN

## 2019-05-06 NOTE — Progress Notes (Signed)
Rx sent for Nausea per protocol.

## 2019-05-27 ENCOUNTER — Encounter: Payer: Self-pay | Admitting: Advanced Practice Midwife

## 2019-05-27 ENCOUNTER — Ambulatory Visit (INDEPENDENT_AMBULATORY_CARE_PROVIDER_SITE_OTHER): Payer: Medicaid Other | Admitting: Advanced Practice Midwife

## 2019-05-27 ENCOUNTER — Other Ambulatory Visit: Payer: Self-pay

## 2019-05-27 VITALS — BP 121/78 | HR 90 | Wt 276.0 lb

## 2019-05-27 DIAGNOSIS — O09891 Supervision of other high risk pregnancies, first trimester: Secondary | ICD-10-CM

## 2019-05-27 DIAGNOSIS — O99211 Obesity complicating pregnancy, first trimester: Secondary | ICD-10-CM | POA: Diagnosis not present

## 2019-05-27 DIAGNOSIS — O0991 Supervision of high risk pregnancy, unspecified, first trimester: Secondary | ICD-10-CM | POA: Diagnosis not present

## 2019-05-27 DIAGNOSIS — Z3481 Encounter for supervision of other normal pregnancy, first trimester: Secondary | ICD-10-CM | POA: Diagnosis not present

## 2019-05-27 DIAGNOSIS — O0990 Supervision of high risk pregnancy, unspecified, unspecified trimester: Secondary | ICD-10-CM

## 2019-05-27 DIAGNOSIS — Z3A11 11 weeks gestation of pregnancy: Secondary | ICD-10-CM

## 2019-05-27 DIAGNOSIS — Z8751 Personal history of pre-term labor: Secondary | ICD-10-CM

## 2019-05-27 NOTE — Progress Notes (Addendum)
Subjective:   Alexandra Raymond is a 29 y.o. H0T8882 at 33w1dby LMP being seen today for her first obstetrical visit.  Her obstetrical history is significant for preterm delivery at 29 weeks with first pregnancy followed by term vaginal delivery and has Irregular menstrual cycle; PCOS (polycystic ovarian syndrome); Amenorrhea; Vaginitis and vulvovaginitis, unspecified; Unspecified symptom associated with female genital organs; Personal history of pre-term labor; Encounter for fetal anatomic survey; [redacted] weeks gestation of pregnancy; [redacted] weeks gestation of pregnancy; History of preterm delivery; [redacted] weeks gestation of pregnancy; [redacted] weeks gestation of pregnancy; Obesity affecting pregnancy, antepartum; Labor and delivery indication for care or intervention; NSVD (normal spontaneous vaginal delivery); and Supervision of normal pregnancy, antepartum on their problem list.. Patient does not intend to breast feed. Pregnancy history fully reviewed.  Patient reports spotting and bleeding 3 days ago, none since.  HISTORY: OB History  Gravida Para Term Preterm AB Living  '3 2 1 1 ' 0 2  SAB TAB Ectopic Multiple Live Births  0 0 0 0 2    # Outcome Date GA Lbr Len/2nd Weight Sex Delivery Anes PTL Lv  3 Current           2 Term 10/10/14 358w3d3:42 / 00:10 7 lb 4.9 oz (3.315 kg) F Vag-Spont Local  LIV     Name: BEMARYLIN, LATHON   Apgar1: 9  Apgar5: 9  1 Preterm 10/09/08 2912w0d lb 1 oz (1.389 kg) M Vag-Spont None Y LIV   Past Medical History:  Diagnosis Date  . Headache   . History of preterm labor   . PCOS (polycystic ovarian syndrome)    Past Surgical History:  Procedure Laterality Date  . BACK SURGERY    . cystectomy    . WISDOM TOOTH EXTRACTION     Family History  Problem Relation Age of Onset  . Diabetes Mother    Social History   Tobacco Use  . Smoking status: Never Smoker  . Smokeless tobacco: Never Used  Substance Use Topics  . Alcohol use: No    Alcohol/week: 0.0  standard drinks  . Drug use: No   No Known Allergies Current Outpatient Medications on File Prior to Visit  Medication Sig Dispense Refill  . Prenatal Vit-Fe Fumarate-FA (PNV PRENATAL PLUS MULTIVITAMIN) 27-1 MG TABS take 1 tablet by mouth once daily BEFORE BREAKFAST 30 tablet 11  . promethazine (PHENERGAN) 25 MG tablet Take 1 tablet (25 mg total) by mouth every 6 (six) hours as needed for nausea or vomiting. 30 tablet 0  . Blood Pressure Monitor KIT 1 Device by Does not apply route once a week. To be monitored Regularly at home. (Patient not taking: Reported on 04/21/2019) 1 kit 0  . ibuprofen (ADVIL) 800 MG tablet Take 1 tablet (800 mg total) by mouth every 8 (eight) hours as needed. (Patient not taking: Reported on 11/03/2018) 30 tablet 5  . Levonorgest-Eth Estrad-Fe Bisg (BALCOLTRA) 0.1-20 MG-MCG(21) TABS Take 1 tablet by mouth daily. (Patient not taking: Reported on 04/21/2019) 28 tablet 11  . Omega-3 Fatty Acids (FISH OIL) 1000 MG CAPS Take 1 capsule by mouth daily. Reported on 03/21/2015     No current facility-administered medications on file prior to visit.     Indications for ASA therapy (per uptodate) One of the following: Previous pregnancy with preeclampsia, especially early onset and with an adverse outcome No Multifetal gestation No Chronic hypertension No Type 1 or 2 diabetes mellitus No Chronic kidney disease No  Autoimmune disease (antiphospholipid syndrome, systemic lupus erythematosus) No  Two or more of the following: Nulliparity No Obesity (body mass index >30 kg/m2) Yes Family history of preeclampsia in mother or sister No Age ?35 years No Sociodemographic characteristics (African American race, low socioeconomic level) No Personal risk factors (eg, previous pregnancy with low birth weight or small for gestational age infant, previous adverse pregnancy outcome [eg, stillbirth], interval >10 years between pregnancies) No   Indications for early 1 hour GTT (per  uptodate)  BMI >25 (>23 in Asian women) AND one of the following  Gestational diabetes mellitus in a previous pregnancy No Glycated hemoglobin ?5.7 percent (39 mmol/mol), impaired glucose tolerance, or impaired fasting glucose on previous testing No First-degree relative with diabetes No High-risk race/ethnicity (eg, African American, Latino, Native American, [place] American, Pacific Islander) Yes History of cardiovascular disease No Hypertension or on therapy for hypertension No High-density lipoprotein cholesterol level <35 mg/dL (0.90 mmol/L) and/or a triglyceride level >250 mg/dL (2.82 mmol/L) No Polycystic ovary syndrome No Physical inactivity No Other clinical condition associated with insulin resistance (eg, severe obesity, acanthosis nigricans) No Previous birth of an infant weighing ?4000 g No Previous stillbirth of unknown cause No Exam   Vitals:   05/27/19 1016  BP: 121/78  Pulse: 90  Weight: 276 lb (125.2 kg)      Uterus:     Pelvic Exam: Perineum: no hemorrhoids, normal perineum   Vulva: normal external genitalia, no lesions   Vagina:  normal mucosa, normal discharge   Cervix: no lesions and normal, pap smear done.    Adnexa: normal adnexa and no mass, fullness, tenderness   Bony Pelvis: average  System: General: well-developed, well-nourished female in no acute distress   Breast:  normal appearance, no masses or tenderness   Skin: normal coloration and turgor, no rashes   Neurologic: oriented, normal, negative, normal mood   Extremities: normal strength, tone, and muscle mass, ROM of all joints is normal   HEENT PERRLA, extraocular movement intact and sclera clear, anicteric   Mouth/Teeth mucous membranes moist, pharynx normal without lesions and dental hygiene good   Neck supple and no masses   Cardiovascular: regular rate and rhythm   Respiratory:  no respiratory distress, normal breath sounds   Abdomen: soft, non-tender; bowel sounds normal; no masses,  no  organomegaly     Assessment:   Pregnancy: Q3E0923 Patient Active Problem List   Diagnosis Date Noted  . Supervision of normal pregnancy, antepartum 04/21/2019  . Labor and delivery indication for care or intervention 10/10/2014  . NSVD (normal spontaneous vaginal delivery) 10/10/2014  . [redacted] weeks gestation of pregnancy   . Obesity affecting pregnancy, antepartum   . [redacted] weeks gestation of pregnancy   . [redacted] weeks gestation of pregnancy   . History of preterm delivery   . Personal history of pre-term labor   . Encounter for fetal anatomic survey   . [redacted] weeks gestation of pregnancy   . Irregular menstrual cycle 03/03/2013  . PCOS (polycystic ovarian syndrome) 03/03/2013  . Amenorrhea 03/03/2013  . Vaginitis and vulvovaginitis, unspecified 03/03/2013  . Unspecified symptom associated with female genital organs 03/03/2013     Plan:   1. Supervision of high risk pregnancy in first trimester --Anticipatory guidance about next visits/weeks of pregnancy given. --Next visit in 4 weeks in office since unable to obtain [place] today, then virtual visits. Bedside  US reveals visible FHR today.   - Cervicovaginal ancillary only( [place]) - Culture, OB Urine -  Genetic Screening - Hepatitis C Antibody - Babyscripts Schedule Optimization - Obstetric Panel, Including HIV - Hemoglobin A1c  2. Obesity affecting pregnancy in first trimester --BMI 47 - Hemoglobin A1c  3. Hx of preterm delivery, currently pregnant, first trimester --First pregnancy with 29 week delivery, preterm labor x 3-4 weeks prior with hospital admission.   --Had 17-P in second pregnancy with term delivery --Discussed 17-P, new findings and recommendations. Pt undecided, will discuss again at next visit.   Initial labs drawn. Continue prenatal vitamins. Discussed and offered genetic screening options, including Quad screen/AFP, NIPS testing, and option to decline testing. Benefits/risks/alternatives reviewed. Pt aware  that anatomy US is form of genetic screening with lower accuracy in detecting trisomies than blood work.  Pt declines genetic screening today. NIPS: declined. Ultrasound discussed; fetal anatomic survey: requested. Problem list reviewed and updated. The nature of [facility] with multiple MDs and other Advanced Practice Providers was explained to patient; also emphasized that residents, students are part of our team. Routine obstetric precautions reviewed. No follow-ups on file.   Fatima Blank, CNM 05/27/19 10:42 AM

## 2019-05-27 NOTE — Patient Instructions (Signed)
Vaginal Bleeding During Pregnancy, First Trimester  A small amount of bleeding from the vagina (spotting) is relatively common during early pregnancy. It usually stops on its own. Various things may cause bleeding or spotting during early pregnancy. Some bleeding may be related to the pregnancy, and some may not. In many cases, the bleeding is normal and is not a problem. However, bleeding can also be a sign of something serious. Be sure to tell your health care provider about any vaginal bleeding right away. Some possible causes of vaginal bleeding during the first trimester include:  Infection or inflammation of the cervix.  Growths (polyps) on the cervix.  Miscarriage or threatened miscarriage.  Pregnancy tissue developing outside of the uterus (ectopic pregnancy).  A mass of tissue developing in the uterus due to an egg being fertilized incorrectly (molar pregnancy). Follow these instructions at home: Activity  Follow instructions from your health care provider about limiting your activity. Ask what activities are safe for you.  If needed, make plans for someone to help with your regular activities.  Do not have sex or orgasms until your health care provider says that this is safe. General instructions  Take over-the-counter and prescription medicines only as told by your health care provider.  Pay attention to any changes in your symptoms.  Do not use tampons or douche.  Write down how many pads you use each day, how often you change pads, and how soaked (saturated) they are.  If you pass any tissue from your vagina, save the tissue so you can show it to your health care provider.  Keep all follow-up visits as told by your health care provider. This is important. Contact a health care provider if:  You have vaginal bleeding during any part of your pregnancy.  You have cramps or labor pains.  You have a fever. Get help right away if:  You have severe cramps in your  back or abdomen.  You pass large clots or a large amount of tissue from your vagina.  Your bleeding increases.  You feel light-headed or weak, or you faint.  You have chills.  You are leaking fluid or have a gush of fluid from your vagina. Summary  A small amount of bleeding (spotting) from the vagina is relatively common during early pregnancy.  Various things may cause bleeding or spotting in early pregnancy.  Be sure to tell your health care provider about any vaginal bleeding right away. This information is not intended to replace advice given to you by your health care provider. Make sure you discuss any questions you have with your health care provider. Document Revised: 04/21/2018 Document Reviewed: 04/04/2016 Elsevier Patient Education  2020 Elsevier Inc.  

## 2019-05-27 NOTE — Progress Notes (Signed)
NOB states that over the weekend she had some pelvic pain and a little bit of brown and bright red bleeding, pt states that it stopped and there has been none since.

## 2019-05-28 LAB — HEMOGLOBIN A1C
Est. average glucose Bld gHb Est-mCnc: 108 mg/dL
Hgb A1c MFr Bld: 5.4 % (ref 4.8–5.6)

## 2019-05-28 LAB — OBSTETRIC PANEL, INCLUDING HIV
Antibody Screen: NEGATIVE
Basophils Absolute: 0 10*3/uL (ref 0.0–0.2)
Basos: 0 %
EOS (ABSOLUTE): 0 10*3/uL (ref 0.0–0.4)
Eos: 1 %
HIV Screen 4th Generation wRfx: NONREACTIVE
Hematocrit: 39.1 % (ref 34.0–46.6)
Hemoglobin: 12.7 g/dL (ref 11.1–15.9)
Hepatitis B Surface Ag: NEGATIVE
Immature Grans (Abs): 0.1 10*3/uL (ref 0.0–0.1)
Immature Granulocytes: 1 %
Lymphocytes Absolute: 1.9 10*3/uL (ref 0.7–3.1)
Lymphs: 24 %
MCH: 28.1 pg (ref 26.6–33.0)
MCHC: 32.5 g/dL (ref 31.5–35.7)
MCV: 87 fL (ref 79–97)
Monocytes Absolute: 0.5 10*3/uL (ref 0.1–0.9)
Monocytes: 6 %
Neutrophils Absolute: 5.4 10*3/uL (ref 1.4–7.0)
Neutrophils: 68 %
Platelets: 233 10*3/uL (ref 150–450)
RBC: 4.52 x10E6/uL (ref 3.77–5.28)
RDW: 15.2 % (ref 11.7–15.4)
RPR Ser Ql: NONREACTIVE
Rh Factor: POSITIVE
Rubella Antibodies, IGG: 1.44 index (ref >0.99)
WBC: 8 10*3/uL (ref 3.4–10.8)

## 2019-05-28 LAB — HEPATITIS C ANTIBODY: Hep C Virus Ab: <0.1 s/co ratio (ref 0.0–0.9)

## 2019-05-29 LAB — CULTURE, OB URINE

## 2019-05-29 LAB — URINE CULTURE, OB REFLEX

## 2019-06-24 ENCOUNTER — Ambulatory Visit (INDEPENDENT_AMBULATORY_CARE_PROVIDER_SITE_OTHER): Payer: Medicaid Other

## 2019-06-24 ENCOUNTER — Other Ambulatory Visit: Payer: Self-pay

## 2019-06-24 VITALS — BP 129/84 | HR 87 | Wt 277.0 lb

## 2019-06-24 DIAGNOSIS — Z3A15 15 weeks gestation of pregnancy: Secondary | ICD-10-CM

## 2019-06-24 DIAGNOSIS — E669 Obesity, unspecified: Secondary | ICD-10-CM

## 2019-06-24 DIAGNOSIS — O99212 Obesity complicating pregnancy, second trimester: Secondary | ICD-10-CM

## 2019-06-24 DIAGNOSIS — O0992 Supervision of high risk pregnancy, unspecified, second trimester: Secondary | ICD-10-CM

## 2019-06-24 DIAGNOSIS — O0990 Supervision of high risk pregnancy, unspecified, unspecified trimester: Secondary | ICD-10-CM | POA: Diagnosis not present

## 2019-06-24 DIAGNOSIS — O99210 Obesity complicating pregnancy, unspecified trimester: Secondary | ICD-10-CM

## 2019-06-24 DIAGNOSIS — O09212 Supervision of pregnancy with history of pre-term labor, second trimester: Secondary | ICD-10-CM

## 2019-06-24 DIAGNOSIS — Z8751 Personal history of pre-term labor: Secondary | ICD-10-CM

## 2019-06-24 NOTE — Progress Notes (Signed)
ROB    CC: swelling ,light cramps, tenderness in breast, pt notes some days she is SOB.

## 2019-06-24 NOTE — Patient Instructions (Signed)
Hydroxyprogesterone caproate injection for pregnancy What is this medicine? HYDROXYPROGESTERONE (hye drox ee proe JES ter one) is a female hormone. This medicine is used in women who are pregnant and who have delivered a baby too early (preterm) in the past. It helps lower the risk of having a preterm baby again. This medicine may be used for other purposes; ask your health care provider or pharmacist if you have questions. COMMON BRAND NAME(S): Makena What should I tell my health care provider before I take this medicine? They need to know if you have any of these conditions:  breast, cervical, uterine, or vaginal cancer  depression  diabetes or prediabetes  heart disease  high blood pressure  history of blood clots  kidney disease  liver disease  lung or breathing disease, like asthma  migraine headaches  seizures  vaginal bleeding  an unusual or allergic reaction to hydroxyprogesterone, other hormones, castor oil, benzyl alcohol, other medicines, foods, dyes, or preservatives  breast-feeding How should I use this medicine? This medicine is for injection into a muscle or under the skin. You will receive an injection once every week (every 7 days) as directed during your pregnancy. It is given by a health care professional in a hospital or clinic setting. Talk to your pediatrician regarding the use of this medicine in children. While this drug may be prescribed for pregnant women as young as 16 years, precautions do apply. Overdosage: If you think you have taken too much of this medicine contact a poison control center or emergency room at once. NOTE: This medicine is only for you. Do not share this medicine with others. What if I miss a dose? It is important not to miss your dose. Call your doctor or health care professional if you are unable to keep an appointment. What may interact with this medicine? Significant interactions are not expected. This list may not  describe all possible interactions. Give your health care provider a list of all the medicines, herbs, non-prescription drugs, or dietary supplements you use. Also tell them if you smoke, drink alcohol, or use illegal drugs. Some items may interact with your medicine. What should I watch for while using this medicine? Your pregnancy will be monitored carefully while you are receiving this medicine. What side effects may I notice from receiving this medicine? Side effects that you should report to your doctor or health care professional as soon as possible:  allergic reactions like skin rash, itching or hives, swelling of the face, lips, or tongue  breathing problems  depressed mood  increase in blood pressure  increased hunger or thirst  increased urination  signs and symptoms of a blood clot such as breathing problems; changes in vision; chest pain; severe, sudden headache; pain, swelling, warmth in the leg; trouble speaking; sudden numbness or weakness of the face, arm or leg  unusually weak or tired  unusual vaginal bleeding  yellowing of the eyes or skin Side effects that usually do not require medical attention (report to your doctor or health care professional if they continue or are bothersome):  diarrhea  fluid retention and swelling  nausea  pain, redness, or irritation at site where injected This list may not describe all possible side effects. Call your doctor for medical advice about side effects. You may report side effects to FDA at 1-800-FDA-1088. Where should I keep my medicine? This drug is given in a hospital or clinic and will not be stored at home. NOTE: This sheet is a   summary. It may not cover all possible information. If you have questions about this medicine, talk to your doctor, pharmacist, or health care provider.  2020 Elsevier/Gold Standard (2016-09-15 11:14:47)   

## 2019-06-24 NOTE — Progress Notes (Signed)
   PRENATAL VISIT NOTE  Subjective:  Alexandra Raymond is a 29 y.o. A1O8786 at [redacted]w[redacted]d who presents today for routine prenatal care.  She is currently being monitored for supervision of a high-risk pregnancy with problems as listed below.  Patient states that her left breast has been more painful than the right.  She states that at times she is unable to touch her breast.  She states she noticed 3 "bumps" on her left breast, but they disappeared.  She states she has not taken anything for the pain.  She also reports some swelling in her feet. She states at times she is unable to wear shoes, but it is not "an everyday thing." She states she drinks ~4 bottles of water daily and was drinking more, but is unable to due to nausea. Patient states she has medications, but dislikes taking medication. She denies vaginal concerns including discharge, bleeding, leaking, itching, and burning.   Patient Active Problem List   Diagnosis Date Noted  . Supervision of high risk pregnancy, antepartum 04/21/2019  . Obesity in pregnancy, antepartum   . History of preterm delivery   . Irregular menstrual cycle 03/03/2013  . PCOS (polycystic ovarian syndrome) 03/03/2013  . Vaginitis and vulvovaginitis, unspecified 03/03/2013  . Unspecified symptom associated with female genital organs 03/03/2013    The following portions of the patient's history were reviewed and updated as appropriate: allergies, current medications, past family history, past medical history, past social history, past surgical history and problem list. Problem list updated.  Objective:   Vitals:   06/24/19 0825  BP: 129/84  Pulse: 87  Weight: 277 lb (125.6 kg)    Fetal Status: Fetal Heart Rate (bpm): 154   Movement: Absent     General:  Alert, oriented and cooperative. Patient is in no acute distress.  Skin: Skin is warm and dry.   Cardiovascular: Regular rate and rhythm.  Respiratory: Normal respiratory effort. CTA-Bilaterally  Abdomen:  Soft, gravid, appropriate for gestational age.  Pelvic: Cervical exam deferred        Extremities: Normal range of motion.  Edema: Trace  Mental Status: Normal mood and affect. Normal behavior. Normal judgment and thought content.   Assessment and Plan:  Pregnancy: G3P1102 at [redacted]w[redacted]d  1. Supervision of high risk pregnancy, antepartum -Reviewed complaints:  *Encouraged to increase water intake and elevate feet. *Instructed to take tylenol for breast pain. Discussed usage of bras and considering switching to maternity bras. -Anatomy Ultrasound ordered. -AFP ordered  2. History of preterm delivery -Discussed progesterone injections due to history of preterm delivery. -Reports she did take injections with 2nd pregnancy. Informed that provider strongly recommends s/t history.  -Patient states she will speak to her husband. -Instructed to call office and schedule if she desires injection.  Informed that they should start next week.  -Nurse contacted regarding office procedure and advised: *Have patient send mychart message if desires. *Will give office supply at initial injection and then complete paperwork for weely injections.   3. Obesity in pregnancy, antepartum -TWG 1lbs -Discussed weight gain of between 11-25lbs   Preterm labor symptoms and general obstetric precautions including but not limited to vaginal bleeding, contractions, leaking of fluid and fetal movement were reviewed with the patient.  Please refer to After Visit Summary for other counseling recommendations.  Return in about 4 weeks (around 07/22/2019).  No future appointments.  Cherre Robins, CNM 06/24/2019, 8:48 AM

## 2019-06-26 LAB — AFP, SERUM, OPEN SPINA BIFIDA
AFP MoM: 1.13
AFP Value: 23.9 ng/mL
Gest. Age on Collection Date: 15 weeks
Maternal Age At EDD: 28.5 yr
OSBR Risk 1 IN: 8106
Test Results:: NEGATIVE
Weight: 277 [lb_av]

## 2019-07-05 ENCOUNTER — Ambulatory Visit (INDEPENDENT_AMBULATORY_CARE_PROVIDER_SITE_OTHER): Payer: Medicaid Other | Admitting: *Deleted

## 2019-07-05 ENCOUNTER — Other Ambulatory Visit: Payer: Self-pay

## 2019-07-05 VITALS — BP 117/80 | HR 93 | Wt 277.0 lb

## 2019-07-05 DIAGNOSIS — Z3A17 17 weeks gestation of pregnancy: Secondary | ICD-10-CM

## 2019-07-05 DIAGNOSIS — O0992 Supervision of high risk pregnancy, unspecified, second trimester: Secondary | ICD-10-CM

## 2019-07-05 DIAGNOSIS — Z8751 Personal history of pre-term labor: Secondary | ICD-10-CM | POA: Diagnosis not present

## 2019-07-05 DIAGNOSIS — O0990 Supervision of high risk pregnancy, unspecified, unspecified trimester: Secondary | ICD-10-CM

## 2019-07-05 DIAGNOSIS — O09891 Supervision of other high risk pregnancies, first trimester: Secondary | ICD-10-CM

## 2019-07-05 MED ORDER — HYDROXYPROGESTERONE CAPROATE 275 MG/1.1ML ~~LOC~~ SOAJ
275.0000 mg | SUBCUTANEOUS | Status: AC
  Administered 2019-07-05 – 2019-07-12 (×2): 275 mg via SUBCUTANEOUS

## 2019-07-05 NOTE — Progress Notes (Signed)
Pt is in office for initial 17p injection.  Pt tolerated injection well.   Pt made aware of order and scheduling process.   Pt advised to make appt for next injection at check out.   BP 117/80   Pulse 93   Wt 277 lb (125.6 kg)   LMP 03/10/2019   BMI 47.55 kg/m    Administrations This Visit    HYDROXYprogesterone caproate (Makena) autoinjector 275 mg    Admin Date 07/05/2019 Action Given Dose 275 mg Route Subcutaneous Administered By Lanney Gins, CMA

## 2019-07-06 NOTE — Progress Notes (Signed)
Patient was assessed and managed by nursing staff during this encounter. I have reviewed the chart and agree with the documentation and plan. I have also made any necessary editorial changes.  Catalina Antigua, MD 07/06/2019 1:48 PM

## 2019-07-12 ENCOUNTER — Ambulatory Visit (INDEPENDENT_AMBULATORY_CARE_PROVIDER_SITE_OTHER): Payer: Medicaid Other

## 2019-07-12 ENCOUNTER — Other Ambulatory Visit: Payer: Self-pay

## 2019-07-12 DIAGNOSIS — Z3A17 17 weeks gestation of pregnancy: Secondary | ICD-10-CM

## 2019-07-12 DIAGNOSIS — O09212 Supervision of pregnancy with history of pre-term labor, second trimester: Secondary | ICD-10-CM

## 2019-07-12 DIAGNOSIS — O0990 Supervision of high risk pregnancy, unspecified, unspecified trimester: Secondary | ICD-10-CM

## 2019-07-12 NOTE — Progress Notes (Signed)
Pt c/o malaise in L arm lasting a few days after the last injection.   Pt said she received Makena in the hip last pregnancy and never had an issue.  Pt aware she has an option to switch to IM inj.  Pt agrees to get the 17p in the R arm today. She will monitor the side effects with this inj and notify the office if she decides to change to the IM injections.

## 2019-07-13 NOTE — Progress Notes (Signed)
Patient was assessed and managed by nursing staff during this encounter. I have reviewed the chart and agree with the documentation and plan. I have also made any necessary editorial changes.  Catalina Antigua, MD 07/13/2019 8:21 AM

## 2019-07-20 ENCOUNTER — Other Ambulatory Visit: Payer: Self-pay

## 2019-07-20 ENCOUNTER — Encounter: Payer: Self-pay | Admitting: Obstetrics and Gynecology

## 2019-07-20 ENCOUNTER — Ambulatory Visit (INDEPENDENT_AMBULATORY_CARE_PROVIDER_SITE_OTHER): Payer: Medicaid Other | Admitting: Obstetrics and Gynecology

## 2019-07-20 VITALS — BP 109/74 | HR 85 | Wt 280.0 lb

## 2019-07-20 DIAGNOSIS — O09891 Supervision of other high risk pregnancies, first trimester: Secondary | ICD-10-CM

## 2019-07-20 DIAGNOSIS — Z3A18 18 weeks gestation of pregnancy: Secondary | ICD-10-CM | POA: Diagnosis not present

## 2019-07-20 DIAGNOSIS — O09212 Supervision of pregnancy with history of pre-term labor, second trimester: Secondary | ICD-10-CM | POA: Diagnosis not present

## 2019-07-20 DIAGNOSIS — O0992 Supervision of high risk pregnancy, unspecified, second trimester: Secondary | ICD-10-CM

## 2019-07-20 DIAGNOSIS — O0990 Supervision of high risk pregnancy, unspecified, unspecified trimester: Secondary | ICD-10-CM

## 2019-07-20 MED ORDER — HYDROXYPROGESTERONE CAPROATE 250 MG/ML IM OIL
250.0000 mg | TOPICAL_OIL | INTRAMUSCULAR | Status: DC
  Administered 2019-07-20: 250 mg via INTRAMUSCULAR

## 2019-07-20 MED ORDER — HYDROXYPROGESTERONE CAPROATE 250 MG/ML IM OIL: 250.0000 mg | TOPICAL_OIL | Freq: Once | INTRAMUSCULAR | 4 refills | Status: DC

## 2019-07-20 NOTE — Progress Notes (Signed)
Pt presents for ROB and 17p Anatomy scheduled 07/23/19 Pt prefers 17p IM injections, previously used in last pregnancy.  Office supply Makena given LUOQ without difficulty.

## 2019-07-20 NOTE — Progress Notes (Signed)
   PRENATAL VISIT NOTE  Subjective:  Alexandra Raymond is a 29 y.o. [id number] at [redacted]w[redacted]d being seen today for ongoing prenatal care.  She is currently monitored for the following issues for this low-risk pregnancy and has Irregular menstrual cycle; PCOS (polycystic ovarian syndrome); Vaginitis and vulvovaginitis, unspecified; Unspecified symptom associated with female genital organs; History of preterm delivery; Obesity in pregnancy, antepartum; and Supervision of high risk pregnancy, antepartum on their problem list.  Patient reports no complaints.  Contractions: Not present. Vag. Bleeding: None.  Movement: Absent. Denies leaking of fluid.   The following portions of the patient's history were reviewed and updated as appropriate: allergies, current medications, past family history, past medical history, past social history, past surgical history and problem list.   Objective:   Vitals:   07/20/19 1602  BP: 109/74  Pulse: 85  Weight: 127 kg    Fetal Status: Fetal Heart Rate (bpm): 153    Movement: Absent     General:  Alert, oriented and cooperative. Patient is in no acute distress.  Skin: Skin is warm and dry. No rash noted.   Cardiovascular: Normal heart rate noted  Respiratory: Normal respiratory effort, no problems with respiration noted  Abdomen: Soft, gravid, appropriate for gestational age.  Pain/Pressure: Present     Pelvic: Cervical exam deferred        Extremities: Normal range of motion.  Edema: None  Mental Status: Normal mood and affect. Normal behavior. Normal judgment and thought content.   Assessment and Plan:  Pregnancy: G3P1102 at [redacted]w[redacted]d 1. Supervision of high risk pregnancy, antepartum Patient for OB ultrasound in 2 days.  2. Hx of preterm delivery, currently pregnant, first trimester 108 OH-P given today.  Patient to continue with weekly 17 OH-P injection.  Preterm labor symptoms and general obstetric precautions including but not limited to vaginal bleeding,  contractions, leaking of fluid and fetal movement were reviewed in detail with the patient. Please refer to After Visit Summary for other counseling recommendations.   Return in about 4 weeks (around 08/17/2019) for HROB.  Future Appointments  Date Time Provider Department Center  07/22/2019 11:00 AM [facility]    Johnny Bridge, MD

## 2019-07-21 ENCOUNTER — Other Ambulatory Visit: Payer: Self-pay

## 2019-07-21 ENCOUNTER — Telehealth: Payer: Self-pay

## 2019-07-21 MED ORDER — HYDROXYPROGESTERONE CAPROATE 250 MG/ML IM OIL: 250.0000 mg | TOPICAL_OIL | Freq: Once | INTRAMUSCULAR | 4 refills | Status: DC

## 2019-07-21 NOTE — Progress Notes (Signed)
Pt requests to switch from [facility] autoinjector to [facility] IM

## 2019-07-21 NOTE — Telephone Encounter (Signed)
Returned call and advised that 17-p will be sent to compounding pharmacy and sent to our office, pt agreed.

## 2019-07-22 ENCOUNTER — Other Ambulatory Visit: Payer: Self-pay

## 2019-07-22 ENCOUNTER — Telehealth: Payer: Medicaid Other | Admitting: Obstetrics & Gynecology

## 2019-07-22 ENCOUNTER — Ambulatory Visit: Payer: Medicaid Other | Attending: Obstetrics and Gynecology

## 2019-07-22 ENCOUNTER — Other Ambulatory Visit: Payer: Self-pay | Admitting: *Deleted

## 2019-07-22 ENCOUNTER — Ambulatory Visit (HOSPITAL_COMMUNITY): Payer: Medicaid Other

## 2019-07-22 ENCOUNTER — Ambulatory Visit: Payer: Medicaid Other | Admitting: *Deleted

## 2019-07-22 DIAGNOSIS — O2692 Pregnancy related conditions, unspecified, second trimester: Secondary | ICD-10-CM | POA: Diagnosis not present

## 2019-07-22 DIAGNOSIS — O99210 Obesity complicating pregnancy, unspecified trimester: Secondary | ICD-10-CM | POA: Diagnosis not present

## 2019-07-22 DIAGNOSIS — Z362 Encounter for other antenatal screening follow-up: Secondary | ICD-10-CM

## 2019-07-22 DIAGNOSIS — Z8751 Personal history of pre-term labor: Secondary | ICD-10-CM | POA: Diagnosis not present

## 2019-07-22 DIAGNOSIS — O0990 Supervision of high risk pregnancy, unspecified, unspecified trimester: Secondary | ICD-10-CM

## 2019-07-22 DIAGNOSIS — O09212 Supervision of pregnancy with history of pre-term labor, second trimester: Secondary | ICD-10-CM | POA: Diagnosis not present

## 2019-07-22 DIAGNOSIS — Z3A19 19 weeks gestation of pregnancy: Secondary | ICD-10-CM | POA: Diagnosis not present

## 2019-07-22 DIAGNOSIS — E669 Obesity, unspecified: Secondary | ICD-10-CM | POA: Diagnosis not present

## 2019-07-22 DIAGNOSIS — Z363 Encounter for antenatal screening for malformations: Secondary | ICD-10-CM | POA: Diagnosis not present

## 2019-07-22 DIAGNOSIS — O99212 Obesity complicating pregnancy, second trimester: Secondary | ICD-10-CM

## 2019-07-27 ENCOUNTER — Other Ambulatory Visit: Payer: Self-pay

## 2019-07-27 ENCOUNTER — Ambulatory Visit (INDEPENDENT_AMBULATORY_CARE_PROVIDER_SITE_OTHER): Payer: Medicaid Other

## 2019-07-27 DIAGNOSIS — O09212 Supervision of pregnancy with history of pre-term labor, second trimester: Secondary | ICD-10-CM

## 2019-07-27 DIAGNOSIS — Z3A19 19 weeks gestation of pregnancy: Secondary | ICD-10-CM | POA: Diagnosis not present

## 2019-07-27 DIAGNOSIS — O0990 Supervision of high risk pregnancy, unspecified, unspecified trimester: Secondary | ICD-10-CM

## 2019-07-27 MED ORDER — HYDROXYPROGESTERONE CAPROATE 275 MG/1.1ML ~~LOC~~ SOAJ: 275.0000 mg | Freq: Once | SUBCUTANEOUS | Status: DC

## 2019-07-27 MED ORDER — HYDROXYPROGESTERONE CAPROATE 250 MG/ML IM OIL
250.0000 mg | TOPICAL_OIL | Freq: Once | INTRAMUSCULAR | Status: AC
  Administered 2019-07-27: 250 mg via INTRAMUSCULAR

## 2019-07-27 NOTE — Progress Notes (Signed)
Patient was assessed and managed by nursing staff during this encounter. I have reviewed the chart and agree with the documentation and plan. I have also made any necessary editorial changes.  Warden Fillers, MD 07/27/2019 1:04 PM

## 2019-07-27 NOTE — Progress Notes (Signed)
Pt is here for 17P injection. IM Injection given in RUOQ without difficulty. Pt denies any preterm labor symptoms, denies VB and cramping.

## 2019-08-03 ENCOUNTER — Other Ambulatory Visit: Payer: Self-pay

## 2019-08-03 ENCOUNTER — Ambulatory Visit (INDEPENDENT_AMBULATORY_CARE_PROVIDER_SITE_OTHER): Payer: Medicaid Other

## 2019-08-03 DIAGNOSIS — Z3A20 20 weeks gestation of pregnancy: Secondary | ICD-10-CM

## 2019-08-03 DIAGNOSIS — O09212 Supervision of pregnancy with history of pre-term labor, second trimester: Secondary | ICD-10-CM | POA: Diagnosis not present

## 2019-08-03 DIAGNOSIS — Z8751 Personal history of pre-term labor: Secondary | ICD-10-CM

## 2019-08-03 MED ORDER — HYDROXYPROGESTERONE CAPROATE 250 MG/ML IM OIL
250.0000 mg | TOPICAL_OIL | INTRAMUSCULAR | Status: DC
  Administered 2019-08-03 – 2019-11-16 (×16): 250 mg via INTRAMUSCULAR

## 2019-08-03 NOTE — Progress Notes (Signed)
Nurse visit pt supply 17p Makena  Injection given LUOQ per pt's request Pt to make appt next week for another inj.

## 2019-08-03 NOTE — Progress Notes (Signed)
Patient was assessed and managed by nursing staff during this encounter. I have reviewed the chart and agree with the documentation and plan. I have also made any necessary editorial changes.  Conchetta Lamia, MD 08/03/2019 11:23 AM 

## 2019-08-10 ENCOUNTER — Other Ambulatory Visit: Payer: Self-pay

## 2019-08-10 ENCOUNTER — Ambulatory Visit (INDEPENDENT_AMBULATORY_CARE_PROVIDER_SITE_OTHER): Payer: Medicaid Other

## 2019-08-10 DIAGNOSIS — Z8751 Personal history of pre-term labor: Secondary | ICD-10-CM

## 2019-08-10 DIAGNOSIS — Z3A21 21 weeks gestation of pregnancy: Secondary | ICD-10-CM

## 2019-08-10 DIAGNOSIS — O09212 Supervision of pregnancy with history of pre-term labor, second trimester: Secondary | ICD-10-CM

## 2019-08-10 NOTE — Progress Notes (Signed)
Agree with A & P. 

## 2019-08-10 NOTE — Progress Notes (Signed)
Pt is in the office for 17-p injection. Administered in RUOQ and pt tolerated well. .. Administrations This Visit    hydroxyprogesterone caproate (MAKENA) 250 mg/mL injection 250 mg    Admin Date 08/10/2019 Action Given Dose 250 mg Route Intramuscular Administered By Katrina Stack, RN

## 2019-08-17 ENCOUNTER — Encounter: Payer: Self-pay | Admitting: Obstetrics

## 2019-08-17 ENCOUNTER — Other Ambulatory Visit: Payer: Self-pay

## 2019-08-17 ENCOUNTER — Ambulatory Visit (INDEPENDENT_AMBULATORY_CARE_PROVIDER_SITE_OTHER): Payer: Medicaid Other | Admitting: Obstetrics

## 2019-08-17 DIAGNOSIS — O99210 Obesity complicating pregnancy, unspecified trimester: Secondary | ICD-10-CM

## 2019-08-17 DIAGNOSIS — O0990 Supervision of high risk pregnancy, unspecified, unspecified trimester: Secondary | ICD-10-CM

## 2019-08-17 DIAGNOSIS — Z8751 Personal history of pre-term labor: Secondary | ICD-10-CM

## 2019-08-17 NOTE — Progress Notes (Signed)
Patient presents for ROB and Makena injection.

## 2019-08-17 NOTE — Progress Notes (Signed)
Subjective:  Alexandra Raymond is a 29 y.o. G3P1102 at [redacted]w[redacted]d being seen today for ongoing prenatal care.  She is currently monitored for the following issues for this high-risk pregnancy and has Irregular menstrual cycle; PCOS (polycystic ovarian syndrome); Vaginitis and vulvovaginitis, unspecified; Unspecified symptom associated with female genital organs; History of preterm delivery; Obesity in pregnancy, antepartum; and Supervision of high risk pregnancy, antepartum on their problem list.  Patient reports no complaints.  Contractions: Not present. Vag. Bleeding: None.  Movement: Present. Denies leaking of fluid.   The following portions of the patient's history were reviewed and updated as appropriate: allergies, current medications, past family history, past medical history, past social history, past surgical history and problem list. Problem list updated.  Objective:  There were no vitals filed for this visit.  Fetal Status: Fetal Heart Rate (bpm): 166   Movement: Present     General:  Alert, oriented and cooperative. Patient is in no acute distress.  Skin: Skin is warm and dry. No rash noted.   Cardiovascular: Normal heart rate noted  Respiratory: Normal respiratory effort, no problems with respiration noted  Abdomen: Soft, gravid, appropriate for gestational age. Pain/Pressure: Absent     Pelvic:  Cervical exam deferred        Extremities: Normal range of motion.     Mental Status: Normal mood and affect. Normal behavior. Normal judgment and thought content.   Urinalysis:      Assessment and Plan:  Pregnancy: G3P1102 at [redacted]w[redacted]d  1. Supervision of high risk pregnancy, antepartum  2. History of preterm delivery  3. Obesity in pregnancy, antepartum   Preterm labor symptoms and general obstetric precautions including but not limited to vaginal bleeding, contractions, leaking of fluid and fetal movement were reviewed in detail with the patient. Please refer to After Visit Summary  for other counseling recommendations.   Return in about 1 week (around 08/24/2019) for [facility].   Brock Bad, MD  08/17/19

## 2019-08-19 ENCOUNTER — Other Ambulatory Visit: Payer: Self-pay

## 2019-08-19 ENCOUNTER — Encounter: Payer: Self-pay | Admitting: *Deleted

## 2019-08-19 ENCOUNTER — Ambulatory Visit: Payer: Medicaid Other | Attending: Obstetrics and Gynecology

## 2019-08-19 ENCOUNTER — Ambulatory Visit: Payer: Medicaid Other | Admitting: *Deleted

## 2019-08-19 ENCOUNTER — Other Ambulatory Visit: Payer: Self-pay | Admitting: *Deleted

## 2019-08-19 DIAGNOSIS — Z3A23 23 weeks gestation of pregnancy: Secondary | ICD-10-CM | POA: Diagnosis not present

## 2019-08-19 DIAGNOSIS — O99282 Endocrine, nutritional and metabolic diseases complicating pregnancy, second trimester: Secondary | ICD-10-CM

## 2019-08-19 DIAGNOSIS — O09212 Supervision of pregnancy with history of pre-term labor, second trimester: Secondary | ICD-10-CM

## 2019-08-19 DIAGNOSIS — E282 Polycystic ovarian syndrome: Secondary | ICD-10-CM

## 2019-08-19 DIAGNOSIS — Z362 Encounter for other antenatal screening follow-up: Secondary | ICD-10-CM

## 2019-08-19 DIAGNOSIS — O0990 Supervision of high risk pregnancy, unspecified, unspecified trimester: Secondary | ICD-10-CM

## 2019-08-19 DIAGNOSIS — O99212 Obesity complicating pregnancy, second trimester: Secondary | ICD-10-CM

## 2019-08-19 DIAGNOSIS — Z6841 Body Mass Index (BMI) 40.0 and over, adult: Secondary | ICD-10-CM

## 2019-08-20 ENCOUNTER — Encounter: Payer: Medicaid Other | Admitting: Obstetrics

## 2019-08-24 ENCOUNTER — Ambulatory Visit (INDEPENDENT_AMBULATORY_CARE_PROVIDER_SITE_OTHER): Payer: Medicaid Other | Admitting: Obstetrics and Gynecology

## 2019-08-24 ENCOUNTER — Other Ambulatory Visit: Payer: Self-pay

## 2019-08-24 ENCOUNTER — Encounter: Payer: Self-pay | Admitting: Obstetrics and Gynecology

## 2019-08-24 VITALS — BP 115/76 | HR 96 | Wt 276.9 lb

## 2019-08-24 DIAGNOSIS — O0990 Supervision of high risk pregnancy, unspecified, unspecified trimester: Secondary | ICD-10-CM

## 2019-08-24 DIAGNOSIS — Z8751 Personal history of pre-term labor: Secondary | ICD-10-CM

## 2019-08-24 DIAGNOSIS — O99210 Obesity complicating pregnancy, unspecified trimester: Secondary | ICD-10-CM

## 2019-08-24 NOTE — Progress Notes (Signed)
   PRENATAL VISIT NOTE  Subjective:  Alexandra Raymond is a 29 y.o. G3P1102 at [redacted]w[redacted]d being seen today for ongoing prenatal care.  She is currently monitored for the following issues for this high-risk pregnancy and has Irregular menstrual cycle; PCOS (polycystic ovarian syndrome); Vaginitis and vulvovaginitis, unspecified; Unspecified symptom associated with female genital organs; History of preterm delivery; Obesity in pregnancy, antepartum; and Supervision of high risk pregnancy, antepartum on their problem list.  Patient reports no complaints.  Contractions: Not present. Vag. Bleeding: None.  Movement: Present. Denies leaking of fluid.   The following portions of the patient's history were reviewed and updated as appropriate: allergies, current medications, past family history, past medical history, past social history, past surgical history and problem list.   Objective:   Vitals:   08/24/19 1624  BP: 115/76  Pulse: 96  Weight: 276 lb 14.4 oz (125.6 kg)    Fetal Status: Fetal Heart Rate (bpm): 153 Fundal Height: 25 cm Movement: Present     General:  Alert, oriented and cooperative. Patient is in no acute distress.  Skin: Skin is warm and dry. No rash noted.   Cardiovascular: Normal heart rate noted  Respiratory: Normal respiratory effort, no problems with respiration noted  Abdomen: Soft, gravid, appropriate for gestational age.  Pain/Pressure: Absent     Pelvic: Cervical exam deferred        Extremities: Normal range of motion.  Edema: None  Mental Status: Normal mood and affect. Normal behavior. Normal judgment and thought content.   Assessment and Plan:  Pregnancy: G3P1102 at [redacted]w[redacted]d 1. Supervision of high risk pregnancy, antepartum - Patient for 2hr Gtt at next appointment.  2. History of Preterm Delivery - 17P given today. - Preterm labor precautions discussed.    Preterm labor symptoms and general obstetric precautions including but not limited to vaginal bleeding,  contractions, leaking of fluid and fetal movement were reviewed in detail with the patient. Please refer to After Visit Summary for other counseling recommendations.   Return in about 4 weeks (around 09/21/2019) for ROB with 2hr Gtt.  Future Appointments  Date Time Provider Department Center  08/31/2019 10:40 AM [facility] NURSE [facility] None  09/17/2019 11:00 AM [facility]SE [facility]  09/17/2019 11:15 AM [facility]    Johnny Bridge, MD

## 2019-08-24 NOTE — Progress Notes (Signed)
Administrations This Visit    hydroxyprogesterone caproate (MAKENA) 250 mg/mL injection 250 mg    Admin Date 08/24/2019 Action Given Dose 250 mg Route Intramuscular Administered By Maretta Bees, RMA

## 2019-08-31 ENCOUNTER — Other Ambulatory Visit: Payer: Self-pay

## 2019-08-31 ENCOUNTER — Ambulatory Visit (INDEPENDENT_AMBULATORY_CARE_PROVIDER_SITE_OTHER): Payer: Medicaid Other | Admitting: *Deleted

## 2019-08-31 VITALS — BP 109/70 | HR 82 | Wt 280.0 lb

## 2019-08-31 DIAGNOSIS — Z8751 Personal history of pre-term labor: Secondary | ICD-10-CM | POA: Diagnosis not present

## 2019-08-31 DIAGNOSIS — O0990 Supervision of high risk pregnancy, unspecified, unspecified trimester: Secondary | ICD-10-CM

## 2019-08-31 NOTE — Progress Notes (Signed)
Patient was assessed and managed by nursing staff during this encounter. I have reviewed the chart and agree with the documentation and plan. I have also made any necessary editorial changes.  Warden Fillers, MD 08/31/2019 11:12 AM

## 2019-08-31 NOTE — Progress Notes (Addendum)
Pt is in office for 17p injection. Pt supplied medication for injection today.  Injection given, pt tolerated well.    Pt has no other concerns today.   Pt to f/u in office for 17p and OB appts.    BP 109/70   Pulse 82   Wt 280 lb (127 kg)   LMP 03/10/2019   BMI 48.06 kg/m   Administrations This Visit    hydroxyprogesterone caproate (MAKENA) 250 mg/mL injection 250 mg    Admin Date 08/31/2019 Action Given Dose 250 mg Route Intramuscular Administered By Lanney Gins, CMA

## 2019-09-07 ENCOUNTER — Ambulatory Visit (INDEPENDENT_AMBULATORY_CARE_PROVIDER_SITE_OTHER): Payer: Medicaid Other

## 2019-09-07 ENCOUNTER — Other Ambulatory Visit: Payer: Self-pay

## 2019-09-07 VITALS — BP 121/75 | HR 86 | Ht 64.0 in | Wt 280.0 lb

## 2019-09-07 DIAGNOSIS — Z8751 Personal history of pre-term labor: Secondary | ICD-10-CM

## 2019-09-07 DIAGNOSIS — O09891 Supervision of other high risk pregnancies, first trimester: Secondary | ICD-10-CM

## 2019-09-07 NOTE — Progress Notes (Signed)
Patient was assessed and managed by nursing staff during this encounter. I have reviewed the chart and agree with the documentation and plan. I have also made any necessary editorial changes.  Sharen Counter, CNM 09/07/2019 3:21 PM

## 2019-09-07 NOTE — Progress Notes (Signed)
°  SUBJECTIVE OB [redacted]w[redacted]d presents for 17P injection, given in LUOQ, tolerated well.  PLAN Next Makena Injection in 1 week.  Administrations This Visit    hydroxyprogesterone caproate (MAKENA) 250 mg/mL injection 250 mg    Admin Date 09/07/2019 Action Given Dose 250 mg Route Intramuscular Administered By Maretta Bees, RMA         Refills ordered 09/07/2019.

## 2019-09-14 ENCOUNTER — Other Ambulatory Visit: Payer: Self-pay

## 2019-09-14 ENCOUNTER — Ambulatory Visit (INDEPENDENT_AMBULATORY_CARE_PROVIDER_SITE_OTHER): Payer: Medicaid Other

## 2019-09-14 DIAGNOSIS — Z8751 Personal history of pre-term labor: Secondary | ICD-10-CM | POA: Diagnosis not present

## 2019-09-14 NOTE — Progress Notes (Signed)
Pt is in the office for 17-p injection, administered in RUOQ and pt tolerated well.  .. Administrations This Visit    hydroxyprogesterone caproate (MAKENA) 250 mg/mL injection 250 mg    Admin Date 09/14/2019 Action Given Dose 250 mg Route Intramuscular Administered By Katrina Stack, RN

## 2019-09-15 NOTE — Progress Notes (Signed)
Patient was assessed and managed by nursing staff during this encounter. I have reviewed the chart and agree with the documentation and plan. I have also made any necessary editorial changes.  Lannie Heaps, MD 09/15/2019 9:37 AM 

## 2019-09-17 ENCOUNTER — Other Ambulatory Visit: Payer: Self-pay

## 2019-09-17 ENCOUNTER — Other Ambulatory Visit: Payer: Self-pay | Admitting: *Deleted

## 2019-09-17 ENCOUNTER — Ambulatory Visit: Payer: Medicaid Other | Attending: Obstetrics and Gynecology

## 2019-09-17 ENCOUNTER — Ambulatory Visit: Payer: Medicaid Other | Admitting: *Deleted

## 2019-09-17 DIAGNOSIS — Z3A27 27 weeks gestation of pregnancy: Secondary | ICD-10-CM

## 2019-09-17 DIAGNOSIS — Z362 Encounter for other antenatal screening follow-up: Secondary | ICD-10-CM | POA: Diagnosis not present

## 2019-09-17 DIAGNOSIS — Z6841 Body Mass Index (BMI) 40.0 and over, adult: Secondary | ICD-10-CM

## 2019-09-17 DIAGNOSIS — O2692 Pregnancy related conditions, unspecified, second trimester: Secondary | ICD-10-CM

## 2019-09-17 DIAGNOSIS — O0990 Supervision of high risk pregnancy, unspecified, unspecified trimester: Secondary | ICD-10-CM

## 2019-09-17 DIAGNOSIS — O99212 Obesity complicating pregnancy, second trimester: Secondary | ICD-10-CM | POA: Diagnosis not present

## 2019-09-17 DIAGNOSIS — O09899 Supervision of other high risk pregnancies, unspecified trimester: Secondary | ICD-10-CM

## 2019-09-17 DIAGNOSIS — O09212 Supervision of pregnancy with history of pre-term labor, second trimester: Secondary | ICD-10-CM

## 2019-09-21 ENCOUNTER — Encounter: Payer: Self-pay | Admitting: Obstetrics and Gynecology

## 2019-09-21 ENCOUNTER — Other Ambulatory Visit: Payer: Self-pay

## 2019-09-21 ENCOUNTER — Ambulatory Visit (INDEPENDENT_AMBULATORY_CARE_PROVIDER_SITE_OTHER): Payer: Medicaid Other | Admitting: Obstetrics and Gynecology

## 2019-09-21 ENCOUNTER — Other Ambulatory Visit: Payer: Medicaid Other

## 2019-09-21 VITALS — BP 111/75 | HR 80 | Wt 283.2 lb

## 2019-09-21 DIAGNOSIS — Z23 Encounter for immunization: Secondary | ICD-10-CM | POA: Diagnosis not present

## 2019-09-21 DIAGNOSIS — O0992 Supervision of high risk pregnancy, unspecified, second trimester: Secondary | ICD-10-CM

## 2019-09-21 DIAGNOSIS — Z3A27 27 weeks gestation of pregnancy: Secondary | ICD-10-CM | POA: Diagnosis not present

## 2019-09-21 DIAGNOSIS — Z8751 Personal history of pre-term labor: Secondary | ICD-10-CM | POA: Diagnosis not present

## 2019-09-21 DIAGNOSIS — O0990 Supervision of high risk pregnancy, unspecified, unspecified trimester: Secondary | ICD-10-CM

## 2019-09-21 NOTE — Progress Notes (Signed)
   PRENATAL VISIT NOTE  Subjective:  Alexandra Raymond is a 29 y.o. G3P1102 at [redacted]w[redacted]d being seen today for ongoing prenatal care.  She is currently monitored for the following issues for this high-risk pregnancy and has Irregular menstrual cycle; PCOS (polycystic ovarian syndrome); Vaginitis and vulvovaginitis, unspecified; Unspecified symptom associated with female genital organs; History of preterm delivery; Obesity in pregnancy, antepartum; and Supervision of high risk pregnancy, antepartum on their problem list.  Patient reports no complaints.  Contractions: Not present. Vag. Bleeding: None.  Movement: Present. Denies leaking of fluid.   The following portions of the patient's history were reviewed and updated as appropriate: allergies, current medications, past family history, past medical history, past social history, past surgical history and problem list.   Objective:   Vitals:   09/21/19 0916  BP: 111/75  Pulse: 80  Weight: 283 lb 3.2 oz (128.5 kg)    Fetal Status: Fetal Heart Rate (bpm): 155 Fundal Height: 28 cm Movement: Present     General:  Alert, oriented and cooperative. Patient is in no acute distress.  Skin: Skin is warm and dry. No rash noted.   Cardiovascular: Normal heart rate noted  Respiratory: Normal respiratory effort, no problems with respiration noted  Abdomen: Soft, gravid, appropriate for gestational age.  Pain/Pressure: Present     Pelvic: Cervical exam deferred        Extremities: Normal range of motion.  Edema: None  Mental Status: Normal mood and affect. Normal behavior. Normal judgment and thought content.   Assessment and Plan:  Pregnancy: G3P1102 at [redacted]w[redacted]d 1. Supervision of high risk pregnancy, antepartum  - Glucose Tolerance, 2 Hours w/1 Hour - CBC - RPR - HIV Antibody (routine testing w rflx) - Tdap vaccine greater than or equal to 7yo IM  2. History of preterm delivery - Continue Makena weekly. - Preterm labor precautions discussed.     Preterm labor symptoms and general obstetric precautions including but not limited to vaginal bleeding, contractions, leaking of fluid and fetal movement were reviewed in detail with the patient. Please refer to After Visit Summary for other counseling recommendations.   Return in about 2 weeks (around 10/05/2019) for HROB.  Future Appointments  Date Time Provider Department Center  10/14/2019  9:30 AM [facility] NURSE [facility]  10/14/2019  9:45 AM [facility]    Johnny Bridge, MD

## 2019-09-21 NOTE — Progress Notes (Signed)
Pt is here for ROB, [redacted]w[redacted]d. Makena injection given in LUOQ, pt tolerated well.

## 2019-09-22 LAB — CBC
Hematocrit: 34.2 % (ref 34.0–46.6)
Hemoglobin: 10.9 g/dL — ABNORMAL LOW (ref 11.1–15.9)
MCH: 28.4 pg (ref 26.6–33.0)
MCHC: 31.9 g/dL (ref 31.5–35.7)
MCV: 89 fL (ref 79–97)
Platelets: 210 10*3/uL (ref 150–450)
RBC: 3.84 x10E6/uL (ref 3.77–5.28)
RDW: 15 % (ref 11.7–15.4)
WBC: 8.3 10*3/uL (ref 3.4–10.8)

## 2019-09-22 LAB — GLUCOSE TOLERANCE, 2 HOURS W/ 1HR
Glucose, 1 hour: 105 mg/dL (ref 65–179)
Glucose, 2 hour: 86 mg/dL (ref 65–152)
Glucose, Fasting: 80 mg/dL (ref 65–91)

## 2019-09-22 LAB — HIV ANTIBODY (ROUTINE TESTING W REFLEX): HIV Screen 4th Generation wRfx: NONREACTIVE

## 2019-09-22 LAB — RPR: RPR Ser Ql: NONREACTIVE

## 2019-09-28 ENCOUNTER — Other Ambulatory Visit: Payer: Self-pay

## 2019-09-28 ENCOUNTER — Ambulatory Visit (INDEPENDENT_AMBULATORY_CARE_PROVIDER_SITE_OTHER): Payer: Medicaid Other

## 2019-09-28 DIAGNOSIS — Z8751 Personal history of pre-term labor: Secondary | ICD-10-CM | POA: Diagnosis not present

## 2019-09-28 MED ORDER — HYDROXYPROGESTERONE CAPROATE 250 MG/ML IM OIL
250.0000 mg | TOPICAL_OIL | Freq: Once | INTRAMUSCULAR | Status: DC
Start: 2019-09-28 — End: 2019-12-08

## 2019-09-28 NOTE — Progress Notes (Signed)
Patient was assessed and managed by nursing staff during this encounter. I have reviewed the chart and agree with the documentation and plan. I have also made any necessary editorial changes.  Jaynie Collins, MD 09/28/2019 11:06 AM

## 2019-09-28 NOTE — Progress Notes (Signed)
ROB [redacted]w[redacted]d  Presents for 17 P Injection .  Makena given without any problems RUOQ .  Pt made aware to keep scheduled appt.

## 2019-10-05 ENCOUNTER — Ambulatory Visit (INDEPENDENT_AMBULATORY_CARE_PROVIDER_SITE_OTHER): Payer: Medicaid Other | Admitting: Obstetrics and Gynecology

## 2019-10-05 ENCOUNTER — Encounter: Payer: Self-pay | Admitting: Obstetrics and Gynecology

## 2019-10-05 ENCOUNTER — Other Ambulatory Visit: Payer: Self-pay

## 2019-10-05 VITALS — BP 114/74 | HR 91 | Wt 276.0 lb

## 2019-10-05 DIAGNOSIS — Z8751 Personal history of pre-term labor: Secondary | ICD-10-CM | POA: Diagnosis not present

## 2019-10-05 DIAGNOSIS — O0990 Supervision of high risk pregnancy, unspecified, unspecified trimester: Secondary | ICD-10-CM

## 2019-10-05 DIAGNOSIS — O99210 Obesity complicating pregnancy, unspecified trimester: Secondary | ICD-10-CM

## 2019-10-05 NOTE — Progress Notes (Signed)
   PRENATAL VISIT NOTE  Subjective:  Alexandra Raymond is a 29 y.o. T5V7616 at [redacted]w[redacted]d being seen today for ongoing prenatal care.  She is currently monitored for the following issues for this high-risk pregnancy and has Irregular menstrual cycle; PCOS (polycystic ovarian syndrome); Vaginitis and vulvovaginitis, unspecified; Unspecified symptom associated with female genital organs; History of preterm delivery; Obesity in pregnancy, antepartum; and Supervision of high risk pregnancy, antepartum on their problem list.  Patient reports no complaints.  Contractions: Not present. Vag. Bleeding: None.  Movement: Present. Denies leaking of fluid.   The following portions of the patient's history were reviewed and updated as appropriate: allergies, current medications, past family history, past medical history, past social history, past surgical history and problem list.   Objective:   Vitals:   10/05/19 1117  BP: 114/74  Pulse: 91  Weight: 276 lb (125.2 kg)    Fetal Status: Fetal Heart Rate (bpm): 159 Fundal Height: 31 cm Movement: Present     General:  Alert, oriented and cooperative. Patient is in no acute distress.  Skin: Skin is warm and dry. No rash noted.   Cardiovascular: Normal heart rate noted  Respiratory: Normal respiratory effort, no problems with respiration noted  Abdomen: Soft, gravid, appropriate for gestational age.  Pain/Pressure: Present     Pelvic: Cervical exam deferred        Extremities: Normal range of motion.  Edema: None  Mental Status: Normal mood and affect. Normal behavior. Normal judgment and thought content.   Assessment and Plan:  Pregnancy: G3P1102 at [redacted]w[redacted]d 1. Supervision of high risk pregnancy, antepartum Patient is doing well without complaints Patient undecided on contraception  2. Obesity in pregnancy, antepartum Follow up growth ultrasound 9/30 Patient is not taking ASA  3. History of preterm delivery Continue weekly 17-P  Preterm labor  symptoms and general obstetric precautions including but not limited to vaginal bleeding, contractions, leaking of fluid and fetal movement were reviewed in detail with the patient. Please refer to After Visit Summary for other counseling recommendations.   Return in about 2 weeks (around 10/19/2019) for in person, ROB, High risk.  Future Appointments  Date Time Provider Department Center  10/14/2019  9:30 AM [facility] NURSE [facility]  10/14/2019  9:45 AM [facility]  10/19/2019 10:45 AM Johnny Bridge, MD [facility] None    Catalina Antigua, MD

## 2019-10-05 NOTE — Progress Notes (Signed)
Pt presents for ROB and 17p. 17p given LUOQ w/o difficulty

## 2019-10-12 ENCOUNTER — Ambulatory Visit (INDEPENDENT_AMBULATORY_CARE_PROVIDER_SITE_OTHER): Payer: Medicaid Other

## 2019-10-12 ENCOUNTER — Other Ambulatory Visit: Payer: Self-pay

## 2019-10-12 VITALS — BP 112/74 | HR 88 | Wt 282.0 lb

## 2019-10-12 DIAGNOSIS — O09891 Supervision of other high risk pregnancies, first trimester: Secondary | ICD-10-CM

## 2019-10-12 DIAGNOSIS — Z8751 Personal history of pre-term labor: Secondary | ICD-10-CM | POA: Diagnosis not present

## 2019-10-12 NOTE — Progress Notes (Signed)
SUBJECTIVE [redacted]w[redacted]d OB presents for 17P Injection, given in RUOQ, tolerated well.  PLAN Return in 1 week for 17P injection Refills ordered today.  Administrations This Visit    hydroxyprogesterone caproate (MAKENA) 250 mg/mL injection 250 mg    Admin Date 10/12/2019 Action Given Dose 250 mg Route Intramuscular Administered By Maretta Bees, RMA

## 2019-10-13 NOTE — Progress Notes (Signed)
Patient was assessed and managed by nursing staff during this encounter. I have reviewed the chart and agree with the documentation and plan. I have also made any necessary editorial changes.  Taaliyah Delpriore, MD 10/13/2019 10:24 AM 

## 2019-10-14 ENCOUNTER — Other Ambulatory Visit: Payer: Self-pay

## 2019-10-14 ENCOUNTER — Ambulatory Visit: Payer: Medicaid Other | Admitting: *Deleted

## 2019-10-14 ENCOUNTER — Other Ambulatory Visit: Payer: Self-pay | Admitting: *Deleted

## 2019-10-14 ENCOUNTER — Ambulatory Visit: Payer: Medicaid Other | Attending: Obstetrics and Gynecology

## 2019-10-14 DIAGNOSIS — O99213 Obesity complicating pregnancy, third trimester: Secondary | ICD-10-CM

## 2019-10-14 DIAGNOSIS — Z3A31 31 weeks gestation of pregnancy: Secondary | ICD-10-CM | POA: Diagnosis not present

## 2019-10-14 DIAGNOSIS — O09899 Supervision of other high risk pregnancies, unspecified trimester: Secondary | ICD-10-CM | POA: Diagnosis not present

## 2019-10-14 DIAGNOSIS — O0990 Supervision of high risk pregnancy, unspecified, unspecified trimester: Secondary | ICD-10-CM | POA: Insufficient documentation

## 2019-10-14 DIAGNOSIS — Z362 Encounter for other antenatal screening follow-up: Secondary | ICD-10-CM

## 2019-10-14 DIAGNOSIS — O09213 Supervision of pregnancy with history of pre-term labor, third trimester: Secondary | ICD-10-CM | POA: Diagnosis not present

## 2019-10-14 DIAGNOSIS — O2693 Pregnancy related conditions, unspecified, third trimester: Secondary | ICD-10-CM

## 2019-10-14 DIAGNOSIS — E282 Polycystic ovarian syndrome: Secondary | ICD-10-CM

## 2019-10-19 ENCOUNTER — Ambulatory Visit (INDEPENDENT_AMBULATORY_CARE_PROVIDER_SITE_OTHER): Payer: Medicaid Other | Admitting: Obstetrics and Gynecology

## 2019-10-19 ENCOUNTER — Encounter: Payer: Self-pay | Admitting: Obstetrics and Gynecology

## 2019-10-19 ENCOUNTER — Other Ambulatory Visit: Payer: Self-pay

## 2019-10-19 VITALS — BP 136/81 | HR 99 | Wt 281.7 lb

## 2019-10-19 DIAGNOSIS — Z8751 Personal history of pre-term labor: Secondary | ICD-10-CM

## 2019-10-19 DIAGNOSIS — O0990 Supervision of high risk pregnancy, unspecified, unspecified trimester: Secondary | ICD-10-CM

## 2019-10-19 DIAGNOSIS — Z3A31 31 weeks gestation of pregnancy: Secondary | ICD-10-CM

## 2019-10-19 DIAGNOSIS — O0993 Supervision of high risk pregnancy, unspecified, third trimester: Secondary | ICD-10-CM

## 2019-10-19 NOTE — Progress Notes (Signed)
Pt presents for ROB and 17p  Pt reports no complaints today per pt  Makena given LUOQ without difficulty  . Administrations This Visit    hydroxyprogesterone caproate (MAKENA) 250 mg/mL injection 250 mg    Admin Date 10/19/2019 Action Given Dose 250 mg Route Intramuscular Administered By Lewayne Bunting, CMA

## 2019-10-19 NOTE — Patient Instructions (Signed)
Preventing Preterm Birth Preterm birth is when your baby is delivered between 20 weeks and 37 weeks of pregnancy. A full-term pregnancy lasts for at least 37 weeks. Preterm birth can be dangerous for your baby because the last few weeks of pregnancy are an important time for your baby's brain and lungs to grow. Many things can cause a baby to be born early. Sometimes the cause is not known. There are certain factors that make you more likely to experience preterm birth, such as:  Having a previous baby born preterm.  Being pregnant with twins or other multiples.  Having had fertility treatment.  Being overweight or underweight at the start of your pregnancy.  Having any of the following during pregnancy: ? An infection, including a urinary tract infection (UTI) or an STI (sexually transmitted infection). ? High blood pressure. ? Diabetes. ? Vaginal bleeding.  Being age 35 or older.  Being age 18 or younger.  Getting pregnant within 6 months of a previous pregnancy.  Suffering extreme stress or physical or emotional abuse during pregnancy.  Standing for long periods of time during pregnancy, such as working at a job that requires standing. What are the risks? The most serious risk of preterm birth is that the baby may not survive. This is more likely to happen if a baby is born before 34 weeks. Other risks and complications of preterm birth may include your baby having:  Breathing problems.  Brain damage that affects movement and coordination (cerebral palsy).  Feeding difficulties.  Vision or hearing problems.  Infections or inflammation of the digestive tract (colitis).  Developmental delays.  Learning disabilities.  Higher risk for diabetes, heart disease, and high blood pressure later in life. What can I do to lower my risk?  Medical care The most important thing you can do to lower your risk for preterm birth is to get routine medical care during pregnancy (prenatal  care). If you have a high risk of preterm birth, you may be referred to a health care provider who specializes in managing high-risk pregnancies (perinatologist). You may be given medicine to help prevent preterm birth. Lifestyle changes Certain lifestyle changes can also lower your risk of preterm birth:  Wait at least 6 months after a pregnancy to become pregnant again.  Try to plan pregnancy for when you are between 19 and 35 years old.  Get to a healthy weight before getting pregnant. If you are overweight, work with your health care provider to safely lose weight.  Do not use any products that contain nicotine or tobacco, such as cigarettes and e-cigarettes. If you need help quitting, ask your health care provider.  Do not drink alcohol.  Do not use drugs. Where to find support For more support, consider:  Talking with your health care provider.  Talking with a therapist or substance abuse counselor, if you need help quitting.  Working with a diet and nutrition specialist (dietitian) or a personal trainer to maintain a healthy weight.  Joining a support group. Where to find more information Learn more about preventing preterm birth from:  Centers for Disease Control and Prevention: cdc.gov/reproductivehealth/maternalinfanthealth/pretermbirth.htm  March of Dimes: marchofdimes.org/complications/premature-babies.aspx  American Pregnancy Association: americanpregnancy.org/labor-and-birth/premature-labor Contact a health care provider if:  You have any of the following signs of preterm labor before 37 weeks: ? A change or increase in vaginal discharge. ? Fluid leaking from your vagina. ? Pressure or cramps in your lower abdomen. ? A backache that does not go away or gets worse. ?   Regular tightening (contractions) in your lower abdomen. Summary  Preterm birth means having your baby during weeks 20-37 of pregnancy.  Preterm birth may put your baby at risk for physical and  mental problems.  Getting good prenatal care can help prevent preterm birth.  You can lower your risk of preterm birth by making certain lifestyle changes, such as not smoking and not using alcohol. This information is not intended to replace advice given to you by your health care provider. Make sure you discuss any questions you have with your health care provider. Document Revised: 12/13/2016 Document Reviewed: 09/09/2015 Elsevier Patient Education  2020 Elsevier Inc.  

## 2019-10-19 NOTE — Progress Notes (Signed)
   PRENATAL VISIT NOTE  Subjective:  Alexandra Raymond is a 29 y.o. D1V6160 at [redacted]w[redacted]d being seen today for ongoing prenatal care.  She is currently monitored for the following issues for this high-risk pregnancy and has PCOS (polycystic ovarian syndrome); History of preterm delivery; Obesity in pregnancy, antepartum; and Supervision of high risk pregnancy, antepartum on their problem list.  Patient reports no complaints.  Contractions: Not present. Vag. Bleeding: None.  Movement: Present. Denies leaking of fluid.   The following portions of the patient's history were reviewed and updated as appropriate: allergies, current medications, past family history, past medical history, past social history, past surgical history and problem list.   Objective:   Vitals:   10/19/19 1051  BP: 136/81  Pulse: 99  Weight: 281 lb 11.2 oz (127.8 kg)    Fetal Status: Fetal Heart Rate (bpm): 151  Fundal Height: 33 cm Movement: Present     General:  Alert, oriented and cooperative. Patient is in no acute distress.  Skin: Skin is warm and dry. No rash noted.   Cardiovascular: Normal heart rate noted  Respiratory: Normal respiratory effort, no problems with respiration noted  Abdomen: Soft, gravid, appropriate for gestational age.  Pain/Pressure: Present     Pelvic: Cervical exam deferred        Extremities: Normal range of motion.  Edema: None  Mental Status: Normal mood and affect. Normal behavior. Normal judgment and thought content.   Assessment and Plan:  Pregnancy: G3P1102 at [redacted]w[redacted]d 1. [redacted] weeks gestation of pregnancy   2. Supervision of high risk pregnancy, antepartum   3. History of preterm delivery - Preterm labor precautions given. - Continue 17P  Preterm labor symptoms and general obstetric precautions including but not limited to vaginal bleeding, contractions, leaking of fluid and fetal movement were reviewed in detail with the patient. Please refer to After Visit Summary for other  counseling recommendations.   Return in about 2 weeks (around 11/02/2019) for HROB.  Future Appointments  Date Time Provider Department Center  10/26/2019 10:00 AM [facility] NURSE [facility] None  11/02/2019 11:15 AM Johnny Bridge, MD [facility] None  11/11/2019 11:00 AM [facility] NURSE [facility]  11/11/2019 11:15 AM [facility]    Johnny Bridge, MD

## 2019-10-26 ENCOUNTER — Ambulatory Visit (INDEPENDENT_AMBULATORY_CARE_PROVIDER_SITE_OTHER): Payer: Medicaid Other

## 2019-10-26 ENCOUNTER — Other Ambulatory Visit: Payer: Self-pay

## 2019-10-26 VITALS — BP 110/72 | HR 89 | Wt 281.0 lb

## 2019-10-26 DIAGNOSIS — O09891 Supervision of other high risk pregnancies, first trimester: Secondary | ICD-10-CM

## 2019-10-26 DIAGNOSIS — Z3A32 32 weeks gestation of pregnancy: Secondary | ICD-10-CM

## 2019-10-26 DIAGNOSIS — O09213 Supervision of pregnancy with history of pre-term labor, third trimester: Secondary | ICD-10-CM

## 2019-10-26 NOTE — Progress Notes (Signed)
SUBJECTIVE [redacted]w[redacted]d OB presents for 17P Injection, given in RUOQ, tolerated well.  PLAN  Return in 1 week for 17P  Administrations This Visit    hydroxyprogesterone caproate (MAKENA) 250 mg/mL injection 250 mg    Admin Date 10/26/2019 Action Given Dose 250 mg Route Intramuscular Administered By Maretta Bees, RMA

## 2019-10-26 NOTE — Progress Notes (Signed)
Patient was assessed and managed by nursing staff during this encounter. I have reviewed the chart and agree with the documentation and plan. I have also made any necessary editorial changes.  Marylen Ponto, NP 10/26/2019 2:12 PM

## 2019-11-02 ENCOUNTER — Encounter: Payer: Self-pay | Admitting: Obstetrics and Gynecology

## 2019-11-02 ENCOUNTER — Ambulatory Visit (INDEPENDENT_AMBULATORY_CARE_PROVIDER_SITE_OTHER): Payer: Medicaid Other | Admitting: Obstetrics and Gynecology

## 2019-11-02 ENCOUNTER — Other Ambulatory Visit: Payer: Self-pay

## 2019-11-02 VITALS — BP 112/76 | HR 86 | Wt 280.0 lb

## 2019-11-02 DIAGNOSIS — Z3A33 33 weeks gestation of pregnancy: Secondary | ICD-10-CM | POA: Insufficient documentation

## 2019-11-02 DIAGNOSIS — E282 Polycystic ovarian syndrome: Secondary | ICD-10-CM | POA: Diagnosis not present

## 2019-11-02 DIAGNOSIS — Z8751 Personal history of pre-term labor: Secondary | ICD-10-CM

## 2019-11-02 DIAGNOSIS — O0990 Supervision of high risk pregnancy, unspecified, unspecified trimester: Secondary | ICD-10-CM

## 2019-11-02 DIAGNOSIS — O0993 Supervision of high risk pregnancy, unspecified, third trimester: Secondary | ICD-10-CM | POA: Diagnosis not present

## 2019-11-02 NOTE — Patient Instructions (Addendum)
Third Trimester of Pregnancy The third trimester is from week 28 through week 40 (months 7 through 9). The third trimester is a time when the unborn baby (fetus) is growing rapidly. At the end of the ninth month, the fetus is about 20 inches in length and weighs 6-10 pounds. Body changes during your third trimester Your body will continue to go through many changes during pregnancy. The changes vary from woman to woman. During the third trimester:  Your weight will continue to increase. You can expect to gain 25-35 pounds (11-16 kg) by the end of the pregnancy.  You may begin to get stretch marks on your hips, abdomen, and breasts.  You may urinate more often because the fetus is moving lower into your pelvis and pressing on your bladder.  You may develop or continue to have heartburn. This is caused by increased hormones that slow down muscles in the digestive tract.  You may develop or continue to have constipation because increased hormones slow digestion and cause the muscles that push waste through your intestines to relax.  You may develop hemorrhoids. These are swollen veins (varicose veins) in the rectum that can itch or be painful.  You may develop swollen, bulging veins (varicose veins) in your legs.  You may have increased body aches in the pelvis, back, or thighs. This is due to weight gain and increased hormones that are relaxing your joints.  You may have changes in your hair. These can include thickening of your hair, rapid growth, and changes in texture. Some women also have hair loss during or after pregnancy, or hair that feels dry or thin. Your hair will most likely return to normal after your baby is born.  Your breasts will continue to grow and they will continue to become tender. A yellow fluid (colostrum) may leak from your breasts. This is the first milk you are producing for your baby.  Your belly button may stick out.  You may notice more swelling in your hands,  face, or ankles.  You may have increased tingling or numbness in your hands, arms, and legs. The skin on your belly may also feel numb.  You may feel short of breath because of your expanding uterus.  You may have more problems sleeping. This can be caused by the size of your belly, increased need to urinate, and an increase in your body's metabolism.  You may notice the fetus "dropping," or moving lower in your abdomen (lightening).  You may have increased vaginal discharge.  You may notice your joints feel loose and you may have pain around your pelvic bone. What to expect at prenatal visits You will have prenatal exams every 2 weeks until week 36. Then you will have weekly prenatal exams. During a routine prenatal visit:  You will be weighed to make sure you and the baby are growing normally.  Your blood pressure will be taken.  Your abdomen will be measured to track your baby's growth.  The fetal heartbeat will be listened to.  Any test results from the previous visit will be discussed.  You may have a cervical check near your due date to see if your cervix has softened or thinned (effaced).  You will be tested for Group B streptococcus. This happens between 35 and 37 weeks. Your health care provider may ask you:  What your birth plan is.  How you are feeling.  If you are feeling the baby move.  If you have had any abnormal   symptoms, such as leaking fluid, bleeding, severe headaches, or abdominal cramping.  If you are using any tobacco products, including cigarettes, chewing tobacco, and electronic cigarettes.  If you have any questions. Other tests or screenings that may be performed during your third trimester include:  Blood tests that check for low iron levels (anemia).  Fetal testing to check the health, activity level, and growth of the fetus. Testing is done if you have certain medical conditions or if there are problems during the pregnancy.  Nonstress test  (NST). This test checks the health of your baby to make sure there are no signs of problems, such as the baby not getting enough oxygen. During this test, a belt is placed around your belly. The baby is made to move, and its heart rate is monitored during movement. What is false labor? False labor is a condition in which you feel small, irregular tightenings of the muscles in the womb (contractions) that usually go away with rest, changing position, or drinking water. These are called Braxton Hicks contractions. Contractions may last for hours, days, or even weeks before true labor sets in. If contractions come at regular intervals, become more frequent, increase in intensity, or become painful, you should see your health care provider. What are the signs of labor?  Abdominal cramps.  Regular contractions that start at 10 minutes apart and become stronger and more frequent with time.  Contractions that start on the top of the uterus and spread down to the lower abdomen and back.  Increased pelvic pressure and dull back pain.  A watery or bloody mucus discharge that comes from the vagina.  Leaking of amniotic fluid. This is also known as your "water breaking." It could be a slow trickle or a gush. Let your health care provider know if it has a color or strange odor. If you have any of these signs, call your health care provider right away, even if it is before your due date. Follow these instructions at home: Medicines  Follow your health care provider's instructions regarding medicine use. Specific medicines may be either safe or unsafe to take during pregnancy.  Take a prenatal vitamin that contains at least 600 micrograms (mcg) of folic acid.  If you develop constipation, try taking a stool softener if your health care provider approves. Eating and drinking   Eat a balanced diet that includes fresh fruits and vegetables, whole grains, good sources of protein such as meat, eggs, or tofu,  and low-fat dairy. Your health care provider will help you determine the amount of weight gain that is right for you.  Avoid raw meat and uncooked cheese. These carry germs that can cause birth defects in the baby.  If you have low calcium intake from food, talk to your health care provider about whether you should take a daily calcium supplement.  Eat four or five small meals rather than three large meals a day.  Limit foods that are high in fat and processed sugars, such as fried and sweet foods.  To prevent constipation: ? Drink enough fluid to keep your urine clear or pale yellow. ? Eat foods that are high in fiber, such as fresh fruits and vegetables, whole grains, and beans. Activity  Exercise only as directed by your health care provider. Most women can continue their usual exercise routine during pregnancy. Try to exercise for 30 minutes at least 5 days a week. Stop exercising if you experience uterine contractions.  Avoid heavy lifting.  Do   not exercise in extreme heat or humidity, or at high altitudes.  Wear low-heel, comfortable shoes.  Practice good posture.  You may continue to have sex unless your health care provider tells you otherwise. Relieving pain and discomfort  Take frequent breaks and rest with your legs elevated if you have leg cramps or low back pain.  Take warm sitz baths to soothe any pain or discomfort caused by hemorrhoids. Use hemorrhoid cream if your health care provider approves.  Wear a good support bra to prevent discomfort from breast tenderness.  If you develop varicose veins: ? Wear support pantyhose or compression stockings as told by your healthcare provider. ? Elevate your feet for 15 minutes, 3-4 times a day. Prenatal care  Write down your questions. Take them to your prenatal visits.  Keep all your prenatal visits as told by your health care provider. This is important. Safety  Wear your seat belt at all times when driving.  Make  a list of emergency phone numbers, including numbers for family, friends, the hospital, and police and fire departments. General instructions  Avoid cat litter boxes and soil used by cats. These carry germs that can cause birth defects in the baby. If you have a cat, ask someone to clean the litter box for you.  Do not travel far distances unless it is absolutely necessary and only with the approval of your health care provider.  Do not use hot tubs, steam rooms, or saunas.  Do not drink alcohol.  Do not use any products that contain nicotine or tobacco, such as cigarettes and e-cigarettes. If you need help quitting, ask your health care provider.  Do not use any medicinal herbs or unprescribed drugs. These chemicals affect the formation and growth of the baby.  Do not douche or use tampons or scented sanitary pads.  Do not cross your legs for long periods of time.  To prepare for the arrival of your baby: ? Take prenatal classes to understand, practice, and ask questions about labor and delivery. ? Make a trial run to the hospital. ? Visit the hospital and tour the maternity area. ? Arrange for maternity or paternity leave through employers. ? Arrange for family and friends to take care of pets while you are in the hospital. ? Purchase a rear-facing car seat and make sure you know how to install it in your car. ? Pack your hospital bag. ? Prepare the baby's nursery. Make sure to remove all pillows and stuffed animals from the baby's crib to prevent suffocation.  Visit your dentist if you have not gone during your pregnancy. Use a soft toothbrush to brush your teeth and be gentle when you floss. Contact a health care provider if:  You are unsure if you are in labor or if your water has broken.  You become dizzy.  You have mild pelvic cramps, pelvic pressure, or nagging pain in your abdominal area.  You have lower back pain.  You have persistent nausea, vomiting, or  diarrhea.  You have an unusual or bad smelling vaginal discharge.  You have pain when you urinate. Get help right away if:  Your water breaks before 37 weeks.  You have regular contractions less than 5 minutes apart before 37 weeks.  You have a fever.  You are leaking fluid from your vagina.  You have spotting or bleeding from your vagina.  You have severe abdominal pain or cramping.  You have rapid weight loss or weight gain.  You have   shortness of breath with chest pain.  You notice sudden or extreme swelling of your face, hands, ankles, feet, or legs.  Your baby makes fewer than 10 movements in 2 hours.  You have severe headaches that do not go away when you take medicine.  You have vision changes. Summary  The third trimester is from week 28 through week 40, months 7 through 9. The third trimester is a time when the unborn baby (fetus) is growing rapidly.  During the third trimester, your discomfort may increase as you and your baby continue to gain weight. You may have abdominal, leg, and back pain, sleeping problems, and an increased need to urinate.  During the third trimester your breasts will keep growing and they will continue to become tender. A yellow fluid (colostrum) may leak from your breasts. This is the first milk you are producing for your baby.  False labor is a condition in which you feel small, irregular tightenings of the muscles in the womb (contractions) that eventually go away. These are called Braxton Hicks contractions. Contractions may last for hours, days, or even weeks before true labor sets in.  Signs of labor can include: abdominal cramps; regular contractions that start at 10 minutes apart and become stronger and more frequent with time; watery or bloody mucus discharge that comes from the vagina; increased pelvic pressure and dull back pain; and leaking of amniotic fluid. This information is not intended to replace advice given to you by your  health care provider. Make sure you discuss any questions you have with your health care provider. Document Revised: 04/23/2018 Document Reviewed: 02/06/2016 Elsevier Patient Education  2020 [employer] Inc[employer]  

## 2019-11-02 NOTE — Progress Notes (Signed)
   PRENATAL VISIT NOTE  Subjective:  Alexandra Raymond is a 29 y.o. [id number] at [redacted]w[redacted]d being seen today for ongoing prenatal care.  She is currently monitored for the following issues for this high-risk pregnancy and has PCOS (polycystic ovarian syndrome); History of preterm delivery; Obesity in pregnancy, antepartum; Supervision of high risk pregnancy, antepartum; and [redacted] weeks gestation of pregnancy on their problem list.  Patient reports no complaints.  Contractions: Not present. Vag. Bleeding: None.  Movement: Present. Denies leaking of fluid.   The following portions of the patient's history were reviewed and updated as appropriate: allergies, current medications, past family history, past medical history, past social history, past surgical history and problem list.   Objective:   Vitals:   11/02/19 1005  BP: 112/76  Pulse: 86  Weight: 280 lb (127 kg)    Fetal Status: Fetal Heart Rate (bpm): 156   Movement: Present     General:  Alert, oriented and cooperative. Patient is in no acute distress.  Skin: Skin is warm and dry. No rash noted.   Cardiovascular: Normal heart rate noted  Respiratory: Normal respiratory effort, no problems with respiration noted  Abdomen: Soft, gravid, appropriate for gestational age.  Pain/Pressure: Present     Pelvic: Cervical exam deferred        Extremities: Normal range of motion.  Edema: None  Mental Status: Normal mood and affect. Normal behavior. Normal judgment and thought content.   Assessment and Plan:  Pregnancy: G3P1102 at [redacted]w[redacted]d 1. Supervision of high risk pregnancy, antepartum, [redacted] weeks gestation of pregnancy: No red flag symptoms or concerns today. S/p Tdap vaccine and taking prenatal vitamins with good adherence. Appropriate fundal height and +FM. -declines flu vaccine and not interested in COVID vaccine in pregnancy or postpartum but counseled on benefits of breastfeeding s/p COVID vaccine -f/u nurse appt in 1 week for 17P and then 2 weeks  for next prenatal appt + last 17P dose -return precautions as noted below for preterm labor  2. History of preterm delivery: H/o prior preterm delivery at 29 weeks. Continued on 17P this pregnancy with most recent dose in clinic today. No signs/symptoms of preterm labor. -f/u nurse appt in 1 week for 17P and then 2 weeks for next prenatal appt + last 17P dose  3. PCOS (polycystic ovarian syndrome)  Preterm labor symptoms and general obstetric precautions including but not limited to vaginal bleeding, contractions, leaking of fluid and fetal movement were reviewed in detail with the patient. Please refer to After Visit Summary for other counseling recommendations.   Return in about 2 weeks (around 11/16/2019) for in person high-risk prenatal appt; 1 week nurse f/u for 17P injection.  Future Appointments  Date Time Provider Department Center  11/02/2019 11:15 AM Sheila Oats, MD [facility] None  11/09/2019  1:20 PM [facility] NURSE [facility] None  11/11/2019 11:00 AM [facility] NURSE [facility]  11/11/2019 11:15 AM [facility]  11/16/2019 10:15 AM Warden Fillers, MD [facility] None  11/23/2019 11:00 AM [facility] NURSE [facility] None    Sheila Oats, MD

## 2019-11-02 NOTE — Progress Notes (Signed)
HOB/17P. 17P given in LUOQ, tolerated well. Next 17P in one week.  Administrations This Visit    hydroxyprogesterone caproate (MAKENA) 250 mg/mL injection 250 mg    Admin Date 11/02/2019 Action Given Dose 250 mg Route Intramuscular Administered By Maretta Bees, RMA

## 2019-11-09 ENCOUNTER — Ambulatory Visit (INDEPENDENT_AMBULATORY_CARE_PROVIDER_SITE_OTHER): Payer: Medicaid Other

## 2019-11-09 ENCOUNTER — Other Ambulatory Visit: Payer: Self-pay

## 2019-11-09 DIAGNOSIS — O0993 Supervision of high risk pregnancy, unspecified, third trimester: Secondary | ICD-10-CM

## 2019-11-09 DIAGNOSIS — Z3A35 35 weeks gestation of pregnancy: Secondary | ICD-10-CM | POA: Diagnosis not present

## 2019-11-09 DIAGNOSIS — Z8751 Personal history of pre-term labor: Secondary | ICD-10-CM

## 2019-11-09 NOTE — Progress Notes (Signed)
Pt is in the office for 17-p injection. Administered in RUOQ and pt tolerated well .Marland Kitchen Administrations This Visit    hydroxyprogesterone caproate (MAKENA) 250 mg/mL injection 250 mg    Admin Date 11/09/2019 Action Given Dose 250 mg Route Intramuscular Administered By Katrina Stack, RN

## 2019-11-10 NOTE — Progress Notes (Signed)
Patient was assessed and managed by nursing staff during this encounter. I have reviewed the chart and agree with the documentation and plan. I have also made any necessary editorial changes.  Frankie Scipio, MD 11/10/2019 3:29 PM 

## 2019-11-11 ENCOUNTER — Other Ambulatory Visit: Payer: Self-pay | Admitting: *Deleted

## 2019-11-11 ENCOUNTER — Encounter: Payer: Self-pay | Admitting: *Deleted

## 2019-11-11 ENCOUNTER — Ambulatory Visit: Payer: Medicaid Other | Admitting: *Deleted

## 2019-11-11 ENCOUNTER — Other Ambulatory Visit: Payer: Self-pay

## 2019-11-11 ENCOUNTER — Ambulatory Visit: Payer: Medicaid Other | Attending: Obstetrics and Gynecology

## 2019-11-11 DIAGNOSIS — E282 Polycystic ovarian syndrome: Secondary | ICD-10-CM

## 2019-11-11 DIAGNOSIS — O99283 Endocrine, nutritional and metabolic diseases complicating pregnancy, third trimester: Secondary | ICD-10-CM | POA: Diagnosis not present

## 2019-11-11 DIAGNOSIS — O0990 Supervision of high risk pregnancy, unspecified, unspecified trimester: Secondary | ICD-10-CM | POA: Insufficient documentation

## 2019-11-11 DIAGNOSIS — Z6841 Body Mass Index (BMI) 40.0 and over, adult: Secondary | ICD-10-CM

## 2019-11-11 DIAGNOSIS — O99213 Obesity complicating pregnancy, third trimester: Secondary | ICD-10-CM

## 2019-11-11 DIAGNOSIS — Z362 Encounter for other antenatal screening follow-up: Secondary | ICD-10-CM

## 2019-11-11 DIAGNOSIS — Z3A35 35 weeks gestation of pregnancy: Secondary | ICD-10-CM

## 2019-11-11 DIAGNOSIS — O09212 Supervision of pregnancy with history of pre-term labor, second trimester: Secondary | ICD-10-CM

## 2019-11-16 ENCOUNTER — Other Ambulatory Visit: Payer: Self-pay

## 2019-11-16 ENCOUNTER — Ambulatory Visit: Payer: Medicaid Other

## 2019-11-16 ENCOUNTER — Ambulatory Visit (INDEPENDENT_AMBULATORY_CARE_PROVIDER_SITE_OTHER): Payer: Medicaid Other | Admitting: Obstetrics and Gynecology

## 2019-11-16 VITALS — BP 123/83 | HR 94 | Wt 275.0 lb

## 2019-11-16 DIAGNOSIS — Z3A35 35 weeks gestation of pregnancy: Secondary | ICD-10-CM | POA: Diagnosis not present

## 2019-11-16 DIAGNOSIS — O0990 Supervision of high risk pregnancy, unspecified, unspecified trimester: Secondary | ICD-10-CM

## 2019-11-16 DIAGNOSIS — Z6841 Body Mass Index (BMI) 40.0 and over, adult: Secondary | ICD-10-CM | POA: Diagnosis not present

## 2019-11-16 DIAGNOSIS — O99210 Obesity complicating pregnancy, unspecified trimester: Secondary | ICD-10-CM

## 2019-11-16 DIAGNOSIS — O99213 Obesity complicating pregnancy, third trimester: Secondary | ICD-10-CM

## 2019-11-16 DIAGNOSIS — Z8751 Personal history of pre-term labor: Secondary | ICD-10-CM | POA: Diagnosis not present

## 2019-11-16 DIAGNOSIS — O0993 Supervision of high risk pregnancy, unspecified, third trimester: Secondary | ICD-10-CM | POA: Diagnosis not present

## 2019-11-16 NOTE — Progress Notes (Signed)
   PRENATAL VISIT NOTE  Subjective:  Alexandra Raymond is a 29 y.o. G8T1572 at [redacted]w[redacted]d being seen today for ongoing prenatal care.  She is currently monitored for the following issues for this high-risk pregnancy and has PCOS (polycystic ovarian syndrome); History of preterm delivery; Obesity in pregnancy, antepartum; Supervision of high risk pregnancy, antepartum; [redacted] weeks gestation of pregnancy; [redacted] weeks gestation of pregnancy; and BMI 45.0-49.9, adult (HCC) on their problem list.  Patient doing well with no acute concerns today. She reports no complaints.  Contractions: Not present. Vag. Bleeding: None.  Movement: Present. Denies leaking of fluid.   The following portions of the patient's history were reviewed and updated as appropriate: allergies, current medications, past family history, past medical history, past social history, past surgical history and problem list. Problem list updated.  Objective:   Vitals:   11/16/19 1022  BP: 123/83  Pulse: 94  Weight: 275 lb (124.7 kg)    Fetal Status: Fetal Heart Rate (bpm): 155   Movement: Present     General:  Alert, oriented and cooperative. Patient is in no acute distress.  Skin: Skin is warm and dry. No rash noted.   Cardiovascular: Normal heart rate noted  Respiratory: Normal respiratory effort, no problems with respiration noted  Abdomen: Soft, gravid, appropriate for gestational age.  Pain/Pressure: Present     Pelvic: Cervical exam deferred        Extremities: Normal range of motion.  Edema: None  Mental Status:  Normal mood and affect. Normal behavior. Normal judgment and thought content.   Assessment and Plan:  Pregnancy: G3P1102 at [redacted]w[redacted]d  1. Supervision of high risk pregnancy, antepartum 36 week labs next week, stop makena after this week  2. Obesity in pregnancy, antepartum Pt has weekly BPP starting 11/4  3. History of preterm delivery Completing makena this week  4. [redacted] weeks gestation of pregnancy   5. BMI  45.0-49.9, adult (HCC)   Preterm labor symptoms and general obstetric precautions including but not limited to vaginal bleeding, contractions, leaking of fluid and fetal movement were reviewed in detail with the patient.  Please refer to After Visit Summary for other counseling recommendations.   Return in about 1 week (around 11/23/2019) for [facility], in person, 36 weeks swabs.   Mariel Aloe, MD

## 2019-11-16 NOTE — Patient Instructions (Addendum)
If you have lab work done today you will be contacted with your lab results within the next 2 weeks.  If you have not heard from [place] then please contact us. The fastest way to get your results is to register for My Chart.   IF you received an x-ray today, you will receive an invoice from [facility]. Please contact [facility] at [phone] with questions or concerns regarding your invoice.   IF you received labwork today, you will receive an invoice from [facility]. Please contact [facility] at [phone] with questions or concerns regarding your invoice.   Our billing staff will not be able to assist you with questions regarding bills from these companies.  You will be contacted with the lab results as soon as they are available. The fastest way to get your results is to activate your My Chart account. Instructions are located on the last page of this paperwork. If you have not heard from [place] regarding the results in 2 weeks, please contact this office.     Group B Streptococcus Test During Pregnancy Why am I having this test? Routine testing, also called screening, for group B streptococcus (GBS) is recommended for all pregnant women between the 36th and 37th week of pregnancy. GBS is a type of bacteria that can be passed from mother to baby during childbirth. Screening will help guide whether or not you will need treatment during labor and delivery to prevent complications such as:  An infection in your uterus during labor.  An infection in your uterus after delivery.  A serious infection in your baby after delivery, such as pneumonia, meningitis, or sepsis. GBS screening is not often done before 36 weeks of pregnancy unless you go into labor prematurely. What happens if I have group B streptococcus? If testing shows that you have GBS, your health care provider will recommend treatment with IV antibiotics during labor and delivery. This treatment significantly  decreases the risk of complications for you and your baby. If you have a planned C-section and you have GBS, you may not need to be treated with antibiotics because GBS is usually passed to babies after labor starts and your water breaks. If you are in labor or your water breaks before your C-section, it is possible for GBS to get into your uterus and be passed to your baby, so you might need treatment. Is there a chance I may not need to be tested? You may not need to be tested for GBS if:  You have a urine test that shows GBS before 36 to 37 weeks.  You had a baby with GBS infection after a previous delivery. In these cases, you will automatically be treated for GBS during labor and delivery. What is being tested? This test is done to check if you have group B streptococcus in your vagina or rectum. What kind of sample is taken? To collect samples for this test, your health care provider will swab your vagina and rectum with a cotton swab. The sample is then sent to the lab to see if GBS is present. What happens during the test?   You will remove your clothing from the waist down.  You will lie down on an exam table in the same position as you would for a pelvic exam.  Your health care provider will swab your vagina and rectum to collect samples for a culture test.  You will be able to go home after the test and do  all your usual activities. How are the results reported? The test results are reported as positive or negative. What do the results mean?  A positive test means you are at risk for passing GBS to your baby during labor and delivery. Your health care provider will recommend that you are treated with an IV antibiotic during labor and delivery.  A negative test means you are at very low risk of passing GBS to your baby. There is still a low risk of passing GBS to your baby because sometimes test results may report that you do not have a condition when you do (false-negative  result) or there is a chance that you may become infected with GBS after the test is done. You most likely will not need to be treated with an antibiotic during labor and delivery. Talk with your health care provider about what your results mean. Questions to ask your health care provider Ask your health care provider, or the department that is doing the test:  When will my results be ready?  How will I get my results?  What are my treatment options? Summary  Routine testing (screening) for group B streptococcus (GBS) is recommended for all pregnant women between the 36th and 37th week of pregnancy.  GBS is a type of bacteria that can be passed from mother to baby during childbirth.  If testing shows that you have GBS, your health care provider will recommend that you are treated with IV antibiotics during labor and delivery. This treatment almost always prevents infection in newborns. This information is not intended to replace advice given to you by your health care provider. Make sure you discuss any questions you have with your health care provider. Document Revised: 04/23/2018 Document Reviewed: 01/28/2018 Elsevier Patient Education  2020 [employer].

## 2019-11-18 ENCOUNTER — Ambulatory Visit: Payer: Medicaid Other | Attending: Obstetrics and Gynecology

## 2019-11-18 ENCOUNTER — Encounter: Payer: Self-pay | Admitting: *Deleted

## 2019-11-18 ENCOUNTER — Other Ambulatory Visit: Payer: Self-pay

## 2019-11-18 ENCOUNTER — Ambulatory Visit: Payer: Medicaid Other | Admitting: *Deleted

## 2019-11-18 DIAGNOSIS — O99213 Obesity complicating pregnancy, third trimester: Secondary | ICD-10-CM | POA: Insufficient documentation

## 2019-11-18 DIAGNOSIS — E282 Polycystic ovarian syndrome: Secondary | ICD-10-CM

## 2019-11-18 DIAGNOSIS — O2693 Pregnancy related conditions, unspecified, third trimester: Secondary | ICD-10-CM

## 2019-11-18 DIAGNOSIS — O0990 Supervision of high risk pregnancy, unspecified, unspecified trimester: Secondary | ICD-10-CM

## 2019-11-18 DIAGNOSIS — Z3A36 36 weeks gestation of pregnancy: Secondary | ICD-10-CM

## 2019-11-18 DIAGNOSIS — Z362 Encounter for other antenatal screening follow-up: Secondary | ICD-10-CM

## 2019-11-18 DIAGNOSIS — Z6841 Body Mass Index (BMI) 40.0 and over, adult: Secondary | ICD-10-CM

## 2019-11-23 ENCOUNTER — Other Ambulatory Visit: Payer: Self-pay

## 2019-11-23 ENCOUNTER — Ambulatory Visit (INDEPENDENT_AMBULATORY_CARE_PROVIDER_SITE_OTHER): Payer: Medicaid Other | Admitting: Obstetrics and Gynecology

## 2019-11-23 ENCOUNTER — Encounter: Payer: Self-pay | Admitting: Obstetrics and Gynecology

## 2019-11-23 ENCOUNTER — Other Ambulatory Visit (HOSPITAL_COMMUNITY)
Admission: RE | Admit: 2019-11-23 | Discharge: 2019-11-23 | Disposition: A | Discharge status: Home / Self Care | Payer: Medicaid Other | Source: Ambulatory Visit | Attending: Obstetrics and Gynecology | Admitting: Obstetrics and Gynecology

## 2019-11-23 ENCOUNTER — Ambulatory Visit: Payer: Medicaid Other

## 2019-11-23 VITALS — BP 122/83 | HR 90 | Wt 283.0 lb

## 2019-11-23 DIAGNOSIS — O99210 Obesity complicating pregnancy, unspecified trimester: Secondary | ICD-10-CM

## 2019-11-23 DIAGNOSIS — Z8751 Personal history of pre-term labor: Secondary | ICD-10-CM

## 2019-11-23 DIAGNOSIS — O0990 Supervision of high risk pregnancy, unspecified, unspecified trimester: Secondary | ICD-10-CM | POA: Diagnosis not present

## 2019-11-23 NOTE — Progress Notes (Signed)
   PRENATAL VISIT NOTE  Subjective:  Alexandra Raymond is a 29 y.o. O1H0865 at [redacted]w[redacted]d being seen today for ongoing prenatal care.  She is currently monitored for the following issues for this high-risk pregnancy and has PCOS (polycystic ovarian syndrome); History of preterm delivery; Obesity in pregnancy, antepartum; Supervision of high risk pregnancy, antepartum; [redacted] weeks gestation of pregnancy; [redacted] weeks gestation of pregnancy; and BMI 45.0-49.9, adult (HCC) on their problem list.  Patient reports no complaints.  Contractions: Not present. Vag. Bleeding: None.  Movement: Present. Denies leaking of fluid.   The following portions of the patient's history were reviewed and updated as appropriate: allergies, current medications, past family history, past medical history, past social history, past surgical history and problem list.   Objective:   Vitals:   11/23/19 1503  BP: 122/83  Pulse: 90  Weight: 283 lb (128.4 kg)    Fetal Status: Fetal Heart Rate (bpm): 150    Movement: Present     General:  Alert, oriented and cooperative. Patient is in no acute distress.  Skin: Skin is warm and dry. No rash noted.   Cardiovascular: Normal heart rate noted  Respiratory: Normal respiratory effort, no problems with respiration noted  Abdomen: Soft, gravid, appropriate for gestational age.  Pain/Pressure: Absent     Pelvic: Cervical exam deferred        Extremities: Normal range of motion.  Edema: None  Mental Status: Normal mood and affect. Normal behavior. Normal judgment and thought content.   Assessment and Plan:  Pregnancy: G3P1102 at [redacted]w[redacted]d 1. Supervision of high risk pregnancy, antepartum - GBS and genital cultures collected.  2. History of preterm delivery   3. Obesity in pregnancy, antepartum - Growth scan completed last week which was 54th percentile.     Preterm labor symptoms and general obstetric precautions including but not limited to vaginal bleeding, contractions, leaking of  fluid and fetal movement were reviewed in detail with the patient. Please refer to After Visit Summary for other counseling recommendations.   No follow-ups on file.  Future Appointments  Date Time Provider Department Center  11/23/2019  4:00 PM Johnny Bridge, MD [facility] None  11/25/2019  3:30 PM [facility] NURSE [facility]  11/25/2019  3:45 PM [facility] US5 [facility]  12/03/2019 12:30 PM [facility] NURSE [facility]  12/03/2019 12:45 PM [facility] US4 [facility]    Johnny Bridge, MD

## 2019-11-23 NOTE — Progress Notes (Signed)
Pt presents for ROB/GBS/GC/CT Pt has no complaints today

## 2019-11-24 LAB — CERVICOVAGINAL ANCILLARY ONLY
Chlamydia: NEGATIVE
Comment: NEGATIVE
Comment: NORMAL
Neisseria Gonorrhea: NEGATIVE

## 2019-11-25 ENCOUNTER — Ambulatory Visit: Payer: Medicaid Other | Admitting: *Deleted

## 2019-11-25 ENCOUNTER — Other Ambulatory Visit: Payer: Self-pay

## 2019-11-25 ENCOUNTER — Ambulatory Visit: Payer: Medicaid Other | Attending: Obstetrics and Gynecology

## 2019-11-25 ENCOUNTER — Encounter: Payer: Self-pay | Admitting: *Deleted

## 2019-11-25 DIAGNOSIS — O2693 Pregnancy related conditions, unspecified, third trimester: Secondary | ICD-10-CM | POA: Diagnosis not present

## 2019-11-25 DIAGNOSIS — Z362 Encounter for other antenatal screening follow-up: Secondary | ICD-10-CM | POA: Diagnosis not present

## 2019-11-25 DIAGNOSIS — O09213 Supervision of pregnancy with history of pre-term labor, third trimester: Secondary | ICD-10-CM | POA: Diagnosis not present

## 2019-11-25 DIAGNOSIS — E282 Polycystic ovarian syndrome: Secondary | ICD-10-CM | POA: Diagnosis not present

## 2019-11-25 DIAGNOSIS — Z3A37 37 weeks gestation of pregnancy: Secondary | ICD-10-CM | POA: Diagnosis not present

## 2019-11-25 DIAGNOSIS — O99213 Obesity complicating pregnancy, third trimester: Secondary | ICD-10-CM

## 2019-11-25 DIAGNOSIS — Z6841 Body Mass Index (BMI) 40.0 and over, adult: Secondary | ICD-10-CM | POA: Insufficient documentation

## 2019-11-25 DIAGNOSIS — O0990 Supervision of high risk pregnancy, unspecified, unspecified trimester: Secondary | ICD-10-CM | POA: Diagnosis not present

## 2019-11-25 LAB — STREP GP B NAA: Strep Gp B NAA: NEGATIVE

## 2019-11-25 NOTE — Progress Notes (Signed)
Pulse 104 by palpation

## 2019-12-01 ENCOUNTER — Encounter: Payer: Medicaid Other | Admitting: Obstetrics and Gynecology

## 2019-12-03 ENCOUNTER — Other Ambulatory Visit: Payer: Self-pay

## 2019-12-03 ENCOUNTER — Ambulatory Visit: Payer: Medicaid Other | Admitting: *Deleted

## 2019-12-03 ENCOUNTER — Ambulatory Visit: Payer: Medicaid Other | Attending: Obstetrics and Gynecology

## 2019-12-03 ENCOUNTER — Encounter: Payer: Self-pay | Admitting: *Deleted

## 2019-12-03 DIAGNOSIS — Z6841 Body Mass Index (BMI) 40.0 and over, adult: Secondary | ICD-10-CM

## 2019-12-03 DIAGNOSIS — O2693 Pregnancy related conditions, unspecified, third trimester: Secondary | ICD-10-CM | POA: Diagnosis not present

## 2019-12-03 DIAGNOSIS — Z3A38 38 weeks gestation of pregnancy: Secondary | ICD-10-CM | POA: Insufficient documentation

## 2019-12-03 DIAGNOSIS — O99213 Obesity complicating pregnancy, third trimester: Secondary | ICD-10-CM | POA: Insufficient documentation

## 2019-12-03 DIAGNOSIS — O09213 Supervision of pregnancy with history of pre-term labor, third trimester: Secondary | ICD-10-CM | POA: Diagnosis not present

## 2019-12-03 DIAGNOSIS — O0990 Supervision of high risk pregnancy, unspecified, unspecified trimester: Secondary | ICD-10-CM

## 2019-12-03 DIAGNOSIS — Z362 Encounter for other antenatal screening follow-up: Secondary | ICD-10-CM | POA: Insufficient documentation

## 2019-12-06 ENCOUNTER — Other Ambulatory Visit: Payer: Self-pay | Admitting: *Deleted

## 2019-12-06 DIAGNOSIS — Z6841 Body Mass Index (BMI) 40.0 and over, adult: Secondary | ICD-10-CM

## 2019-12-08 ENCOUNTER — Other Ambulatory Visit: Payer: Self-pay

## 2019-12-08 ENCOUNTER — Ambulatory Visit (INDEPENDENT_AMBULATORY_CARE_PROVIDER_SITE_OTHER): Payer: Medicaid Other | Admitting: Obstetrics and Gynecology

## 2019-12-08 ENCOUNTER — Encounter: Payer: Self-pay | Admitting: Obstetrics and Gynecology

## 2019-12-08 VITALS — BP 123/83 | HR 93 | Wt 291.0 lb

## 2019-12-08 DIAGNOSIS — O99210 Obesity complicating pregnancy, unspecified trimester: Secondary | ICD-10-CM

## 2019-12-08 DIAGNOSIS — O0990 Supervision of high risk pregnancy, unspecified, unspecified trimester: Secondary | ICD-10-CM

## 2019-12-08 DIAGNOSIS — Z8751 Personal history of pre-term labor: Secondary | ICD-10-CM

## 2019-12-08 NOTE — Progress Notes (Signed)
Subjective:  Alexandra Raymond is a 29 y.o. G3P1102 at [redacted]w[redacted]d being seen today for ongoing prenatal care.  She is currently monitored for the following issues for this high-risk pregnancy and has PCOS (polycystic ovarian syndrome); History of preterm delivery; Obesity in pregnancy, antepartum; Supervision of high risk pregnancy, antepartum; and BMI 45.0-49.9, adult (HCC) on their problem list.  Patient reports general discomforts of pregnancy.  Contractions: Not present. Vag. Bleeding: None.  Movement: Present. Denies leaking of fluid.   The following portions of the patient's history were reviewed and updated as appropriate: allergies, current medications, past family history, past medical history, past social history, past surgical history and problem list. Problem list updated.  Objective:   Vitals:   12/08/19 1023  BP: 123/83  Pulse: 93  Weight: 291 lb (132 kg)    Fetal Status: Fetal Heart Rate (bpm): 155   Movement: Present     General:  Alert, oriented and cooperative. Patient is in no acute distress.  Skin: Skin is warm and dry. No rash noted.   Cardiovascular: Normal heart rate noted  Respiratory: Normal respiratory effort, no problems with respiration noted  Abdomen: Soft, gravid, appropriate for gestational age. Pain/Pressure: Present     Pelvic:  Cervical exam performed        Extremities: Normal range of motion.     Mental Status: Normal mood and affect. Normal behavior. Normal judgment and thought content.   Urinalysis:      Assessment and Plan:  Pregnancy: G3P1102 at [redacted]w[redacted]d  1. Supervision of high risk pregnancy, antepartum Labor precautions Schedule IOL at next visit if undelivered  2. Obesity in pregnancy, antepartum NST today  3. History of preterm delivery   Term labor symptoms and general obstetric precautions including but not limited to vaginal bleeding, contractions, leaking of fluid and fetal movement were reviewed in detail with the patient. Please  refer to After Visit Summary for other counseling recommendations.  Return in about 1 week (around 12/15/2019) for OB visit, face to face, any provider.   Hermina Staggers, MD

## 2019-12-08 NOTE — Addendum Note (Signed)
Addended by: Hamilton Capri on: 12/08/2019 10:54 AM   Modules accepted: Orders

## 2019-12-08 NOTE — Patient Instructions (Signed)

## 2019-12-08 NOTE — Progress Notes (Signed)
Pt would like cervix check today.  

## 2019-12-09 ENCOUNTER — Other Ambulatory Visit: Payer: Self-pay

## 2019-12-09 ENCOUNTER — Encounter (HOSPITAL_COMMUNITY): Payer: Self-pay | Admitting: Obstetrics & Gynecology

## 2019-12-09 ENCOUNTER — Inpatient Hospital Stay (HOSPITAL_COMMUNITY)
Admission: AD | Admit: 2019-12-09 | Discharge: 2019-12-10 | DRG: 807 | Disposition: A | Discharge status: Home / Self Care | Payer: Medicaid Other | Attending: Obstetrics & Gynecology | Admitting: Obstetrics & Gynecology

## 2019-12-09 DIAGNOSIS — Z3A39 39 weeks gestation of pregnancy: Secondary | ICD-10-CM | POA: Diagnosis not present

## 2019-12-09 DIAGNOSIS — E669 Obesity, unspecified: Secondary | ICD-10-CM | POA: Diagnosis present

## 2019-12-09 DIAGNOSIS — Z8751 Personal history of pre-term labor: Secondary | ICD-10-CM

## 2019-12-09 DIAGNOSIS — O99210 Obesity complicating pregnancy, unspecified trimester: Secondary | ICD-10-CM | POA: Diagnosis present

## 2019-12-09 DIAGNOSIS — Z20822 Contact with and (suspected) exposure to covid-19: Secondary | ICD-10-CM | POA: Diagnosis present

## 2019-12-09 DIAGNOSIS — O99214 Obesity complicating childbirth: Principal | ICD-10-CM | POA: Diagnosis present

## 2019-12-09 DIAGNOSIS — O0990 Supervision of high risk pregnancy, unspecified, unspecified trimester: Secondary | ICD-10-CM

## 2019-12-09 DIAGNOSIS — Z6841 Body Mass Index (BMI) 40.0 and over, adult: Secondary | ICD-10-CM

## 2019-12-09 DIAGNOSIS — O26893 Other specified pregnancy related conditions, third trimester: Secondary | ICD-10-CM | POA: Diagnosis not present

## 2019-12-09 LAB — TYPE AND SCREEN
ABO/RH(D): A POS
Antibody Screen: NEGATIVE

## 2019-12-09 LAB — CBC
HCT: 34.9 % — ABNORMAL LOW (ref 36.0–46.0)
Hemoglobin: 10.9 g/dL — ABNORMAL LOW (ref 12.0–15.0)
MCH: 27.3 pg (ref 26.0–34.0)
MCHC: 31.2 g/dL (ref 30.0–36.0)
MCV: 87.5 fL (ref 80.0–100.0)
Platelets: 227 10*3/uL (ref 150–400)
RBC: 3.99 MIL/uL (ref 3.87–5.11)
RDW: 16.1 % — ABNORMAL HIGH (ref 11.5–15.5)
WBC: 7.4 10*3/uL (ref 4.0–10.5)
nRBC: 0 % (ref 0.0–0.2)

## 2019-12-09 LAB — RESP PANEL BY RT-PCR (FLU A&B, COVID) ARPGX2
Influenza A by PCR: NEGATIVE
Influenza B by PCR: NEGATIVE
SARS Coronavirus 2 by RT PCR: NEGATIVE

## 2019-12-09 LAB — RPR: RPR Ser Ql: NONREACTIVE

## 2019-12-09 MED ORDER — COCONUT OIL OIL
1.0000 "application " | TOPICAL_OIL | Status: DC | PRN
  Administered 2019-12-09: 1 via TOPICAL

## 2019-12-09 MED ORDER — MEASLES, MUMPS & RUBELLA VAC IJ SOLR: 0.5000 mL | Freq: Once | INTRAMUSCULAR | Status: DC

## 2019-12-09 MED ORDER — ONDANSETRON HCL 4 MG PO TABS: 4.0000 mg | ORAL_TABLET | ORAL | Status: DC | PRN

## 2019-12-09 MED ORDER — OXYTOCIN-SODIUM CHLORIDE 30-0.9 UT/500ML-% IV SOLN
2.5000 [IU]/h | INTRAVENOUS | Status: DC
  Filled 2019-12-09: qty 500

## 2019-12-09 MED ORDER — OXYCODONE-ACETAMINOPHEN 5-325 MG PO TABS: 1.0000 | ORAL_TABLET | ORAL | Status: DC | PRN

## 2019-12-09 MED ORDER — TETANUS-DIPHTH-ACELL PERTUSSIS 5-2.5-18.5 LF-MCG/0.5 IM SUSY: 0.5000 mL | PREFILLED_SYRINGE | Freq: Once | INTRAMUSCULAR | Status: DC

## 2019-12-09 MED ORDER — LACTATED RINGERS IV SOLN: INTRAVENOUS | Status: DC

## 2019-12-09 MED ORDER — LACTATED RINGERS IV SOLN: 500.0000 mL | INTRAVENOUS | Status: DC | PRN

## 2019-12-09 MED ORDER — ACETAMINOPHEN 325 MG PO TABS: 650.0000 mg | ORAL_TABLET | ORAL | Status: DC | PRN

## 2019-12-09 MED ORDER — FENTANYL CITRATE (PF) 100 MCG/2ML IJ SOLN: 50.0000 ug | INTRAMUSCULAR | Status: DC | PRN

## 2019-12-09 MED ORDER — IBUPROFEN 600 MG PO TABS
600.0000 mg | ORAL_TABLET | Freq: Four times a day (QID) | ORAL | Status: DC
  Administered 2019-12-09 – 2019-12-10 (×3): 600 mg via ORAL
  Filled 2019-12-09 (×3): qty 1

## 2019-12-09 MED ORDER — PRENATAL MULTIVITAMIN CH: 1.0000 | ORAL_TABLET | Freq: Every day | ORAL | Status: DC

## 2019-12-09 MED ORDER — ONDANSETRON HCL 4 MG/2ML IJ SOLN: 4.0000 mg | Freq: Four times a day (QID) | INTRAMUSCULAR | Status: DC | PRN

## 2019-12-09 MED ORDER — OXYCODONE-ACETAMINOPHEN 5-325 MG PO TABS: 2.0000 | ORAL_TABLET | ORAL | Status: DC | PRN

## 2019-12-09 MED ORDER — SOD CITRATE-CITRIC ACID 500-334 MG/5ML PO SOLN: 30.0000 mL | ORAL | Status: DC | PRN

## 2019-12-09 MED ORDER — SENNOSIDES-DOCUSATE SODIUM 8.6-50 MG PO TABS
2.0000 | ORAL_TABLET | ORAL | Status: DC
  Administered 2019-12-09: 2 via ORAL
  Filled 2019-12-09: qty 2

## 2019-12-09 MED ORDER — OXYTOCIN BOLUS FROM INFUSION
333.0000 mL | Freq: Once | INTRAVENOUS | Status: AC
  Administered 2019-12-09: 333 mL via INTRAVENOUS

## 2019-12-09 MED ORDER — DIPHENHYDRAMINE HCL 25 MG PO CAPS: 25.0000 mg | ORAL_CAPSULE | Freq: Four times a day (QID) | ORAL | Status: DC | PRN

## 2019-12-09 MED ORDER — LIDOCAINE HCL (PF) 1 % IJ SOLN: 30.0000 mL | INTRAMUSCULAR | Status: DC | PRN

## 2019-12-09 MED ORDER — WITCH HAZEL-GLYCERIN EX PADS: 1.0000 "application " | MEDICATED_PAD | CUTANEOUS | Status: DC | PRN

## 2019-12-09 MED ORDER — BENZOCAINE-MENTHOL 20-0.5 % EX AERO: 1.0000 "application " | INHALATION_SPRAY | CUTANEOUS | Status: DC | PRN

## 2019-12-09 MED ORDER — SIMETHICONE 80 MG PO CHEW: 80.0000 mg | CHEWABLE_TABLET | ORAL | Status: DC | PRN

## 2019-12-09 MED ORDER — DIBUCAINE (PERIANAL) 1 % EX OINT: 1.0000 "application " | TOPICAL_OINTMENT | CUTANEOUS | Status: DC | PRN

## 2019-12-09 MED ORDER — ONDANSETRON HCL 4 MG/2ML IJ SOLN: 4.0000 mg | INTRAMUSCULAR | Status: DC | PRN

## 2019-12-09 NOTE — MAU Note (Signed)
Presents with c/o ctxs that began @ 0530 this morning, states every 4 minutes.  Denies LOF, states spotting.  Reports no FM this morning.

## 2019-12-09 NOTE — Discharge Instructions (Signed)

## 2019-12-09 NOTE — H&P (Signed)
OBSTETRIC ADMISSION HISTORY AND PHYSICAL  Sonia Bromell is a 29 y.o. female [phone] with IUP at 29w1dby LMP presenting for SOL. She reports regular, strong contractions began this morning around 0530. She reports +FMs, No LOF, no VB, no blurry vision, headaches or peripheral edema, and RUQ pain.  She plans on bottle feeding. She request OCPs for birth control. She [facility] --->  Estimated Date of Delivery: 12/15/19  Sono:    11/11/19_0 , CWD, normal anatomy, cephalic presentation, 28088P 54% EFW   Prenatal History/Complications:  BMI 50 History of preterm delivery (29 weeks)  Past Medical History: Past Medical History:  Diagnosis Date  . Headache   . History of preterm labor   . Irregular menstrual cycle 03/03/2013  . PCOS (polycystic ovarian syndrome)     Past Surgical History: Past Surgical History:  Procedure Laterality Date  . BACK SURGERY    . cystectomy    . WISDOM TOOTH EXTRACTION      Obstetrical History: OB History    Gravida  3   Para  2   Term  1   Preterm  1   AB      Living  2     SAB      TAB      Ectopic      Multiple  0   Live Births  2           Social History Social History   Socioeconomic History  . Marital status: Married    Spouse name: Not on file  . Number of children: Not on file  . Years of education: Not on file  . Highest education level: Not on file  Occupational History  . Not on file  Tobacco Use  . Smoking status: Never Smoker  . Smokeless tobacco: Never Used  Vaping Use  . Vaping Use: Never used  Substance and Sexual Activity  . Alcohol use: No    Alcohol/week: 0.0 standard drinks  . Drug use: No  . Sexual activity: Yes    Partners: Male    Comment: Pregnant   Other Topics Concern  . Not on file  Social History Narrative  . Not on file   Social Determinants of Health   Financial Resource Strain:   . Difficulty of Paying Living Expenses: Not  on file  Food Insecurity:   . Worried About RCharity fundraiserin the Last Year: Not on file  . Ran Out of Food in the Last Year: Not on file  Transportation Needs:   . Lack of Transportation (Medical): Not on file  . Lack of Transportation (Non-Medical): Not on file  Physical Activity:   . Days of Exercise per Week: Not on file  . Minutes of Exercise per Session: Not on file  Stress:   . Feeling of Stress : Not on file  Social Connections:   . Frequency of Communication with Friends and Family: Not on file  . Frequency of Social Gatherings with Friends and Family: Not on file  . Attends Religious Services: Not on file  . Active Member of Clubs or Organizations: Not on file  . Attends CArchivistMeetings: Not on file  . Marital Status: Not on file    Family History: Family History  Problem Relation Age of Onset  . Diabetes Mother     Allergies: No Known Allergies  Medications Prior to Admission  Medication Sig Dispense Refill Last  Dose  . Blood Pressure Monitor KIT 1 Device by Does not apply route once a week. To be monitored Regularly at home. 1 kit 0   . Prenatal Vit-Fe Fumarate-FA (PNV PRENATAL PLUS MULTIVITAMIN) 27-1 MG TABS take 1 tablet by mouth once daily BEFORE BREAKFAST 30 tablet 11 12/07/2019     Review of Systems   All systems reviewed and negative except as stated in HPI  Blood pressure 124/75, pulse 88, temperature 98.5 F (36.9 C), temperature source Oral, resp. rate 20, height _0  (1.626 m), weight 132 kg, last menstrual period 03/10/2019, SpO2 99 %. General appearance: alert, cooperative and no distress Lungs: normal respiratory effort Heart: regular rate and rhythm Abdomen: soft, non-tender; gravid Pelvic: as noted below Extremities: Homans sign is negative, no sign of DVT Presentation: cephalic per RN exam Fetal monitoringBaseline: 1503 bpm, Variability: Good {> 6 bpm), Accelerations: Reactive and Decelerations: Early and variable  intermittent decels with contractions Uterine activityFrequency: Every 1-4 minutes Dilation: 5.5 Effacement (%): 80 Station: -2 Exam by:: Truitt Leep, RNC   Prenatal labs: ABO, Rh: A/Positive/-- (05/13 1112) Antibody: Negative (05/13 1112) Rubella: 1.44 (05/13 1112) RPR: Non Reactive (09/07 1020)  HBsAg: Negative (05/13 1112)  HIV: Non Reactive (09/07 1020)  GBS: Negative/-- (11/09 0334)  2 hr Glucola passed Genetic screening  Declined, AFP neg Anatomy US normal but limited, f/u normal  Prenatal Transfer Tool  Maternal Diabetes: No Genetic Screening: Declined Maternal Ultrasounds/Referrals: Normal Fetal Ultrasounds or other Referrals:  Referred to Materal Fetal Medicine  Maternal Substance Abuse:  No Significant Maternal Medications:  None Significant Maternal Lab Results: Group B Strep negative  No results found for this or any previous visit (from the past 24 hour(s)).  Patient Active Problem List   Diagnosis Date Noted  . BMI 45.0-49.9, adult ([place]) 11/16/2019  . Supervision of high risk pregnancy, antepartum 04/21/2019  . Obesity in pregnancy, antepartum   . History of preterm delivery   . PCOS (polycystic ovarian syndrome) 03/03/2013    Assessment/Plan:  Kajal Scalici is a 29 y.o. X4G0029 at 37w1dhere for SOL.  #Labor: Previously 3cm in clinic, presented to MAU with regular, strong contractions and changed to 5.5cm. Progressing well, continue expectant management. #Pain: PRN #FWB: Cat 2 but overall reassuring #ID: GSB neg #MOF: bottle #MOC: undecided #Circ: n/a  AArrie Senate MD  12/09/2019, 8:21 AM

## 2019-12-09 NOTE — Discharge Summary (Signed)
Postpartum Discharge Summary  Date of Service updated 12/10/19     Patient Name: Alexandra Raymond DOB: Aug 27, 1990 MRN: [id number]  Date of admission: 12/09/2019 Delivery date:12/09/2019  Delivering provider: Arrie Senate  Date of discharge: 12/10/2019  Admitting diagnosis: Normal labor [O80, Z37.9] Intrauterine pregnancy: [redacted]w[redacted]d    Secondary diagnosis:  Active Problems:   History of preterm delivery   Obesity in pregnancy, antepartum   Supervision of high risk pregnancy, antepartum   BMI 45.0-49.9, adult ([place]   Normal labor   Vaginal delivery  Additional problems: none    Discharge diagnosis: Term Pregnancy Delivered                                              Post partum procedures:none Augmentation: N/A Complications: None  Hospital course: Onset of Labor With Vaginal Delivery      29y.o. yo [id number] 28w1das admitted in Active Labor on 12/09/2019. Patient had an uncomplicated labor course as follows:  Membrane Rupture Time/Date: 10:30 AM ,12/09/2019   Delivery Method:Vaginal, Spontaneous  Episiotomy: None  Lacerations:  None  Patient had an uncomplicated postpartum course.  She is ambulating, tolerating a regular diet, passing flatus, and urinating well. Patient is discharged home in stable condition on 12/10/19.  Newborn Data: Birth date:12/09/2019  Birth time:10:32 AM  Gender:Female  Living status:Living  Apgars:9 ,9  Weight:3399 g   Magnesium Sulfate received: No BMZ received: No Rhophylac:N/A MMR:N/A T-DaP:Given prenatally Flu: No, declined Transfusion:No  Physical exam  Vitals:   12/09/19 1725 12/09/19 2111 12/10/19 0120 12/10/19 0500  BP: 125/80 122/70 116/66 119/74  Pulse: 72 73 79 71  Resp: _0 Temp: 98.6 F (37 C) 98.4 F (36.9 C) 98.2 F (36.8 C) 98.1 F (36.7 C)  TempSrc:  Oral Oral Oral  SpO2: 99% 99% 99% 99%  Weight:      Height:       General: alert, cooperative and no distress Lochia:  appropriate Uterine Fundus: firm Incision: N/A DVT Evaluation: No evidence of DVT seen on physical exam. Labs: Lab Results  Component Value Date   WBC 7.4 12/09/2019   HGB 10.9 (L) 12/09/2019   HCT 34.9 (L) 12/09/2019   MCV 87.5 12/09/2019   PLT 227 12/09/2019   CMP Latest Ref Rng & Units 01/27/2018  Glucose 70 - 99 mg/dL 109(H)  BUN 6 - 20 mg/dL 11  Creatinine 0.44 - 1.00 mg/dL 0.58  Sodium 135 - 145 mmol/L 138  Potassium 3.5 - 5.1 mmol/L 3.8  Chloride 98 - 111 mmol/L 105  CO2 22 - 32 mmol/L 24  Calcium 8.9 - 10.3 mg/dL 9.2  Total Protein 6.5 - 8.1 g/dL 7.5  Total Bilirubin 0.3 - 1.2 mg/dL 0.4  Alkaline Phos 38 - 126 U/L 85  AST 15 - 41 U/L 22  ALT 0 - 44 U/L 29   Edinburgh Score: Edinburgh Postnatal Depression Scale Screening Tool 12/09/2019  I have been able to laugh and see the funny side of things. 0  I have looked forward with enjoyment to things. 0  I have blamed myself unnecessarily when things went wrong. 0  I have been anxious or worried for no good reason. 0  I have felt scared or panicky for no good reason. 0  Things have been getting on top of me. 0  I  have been so unhappy that I have had difficulty sleeping. 0  I have felt sad or miserable. 0  I have been so unhappy that I have been crying. 0  The thought of harming myself has occurred to me. 0  Edinburgh Postnatal Depression Scale Total 0     After visit meds:  Allergies as of 12/10/2019   No Known Allergies     Medication List    STOP taking these medications   PNV Prenatal Plus Multivitamin 27-1 MG Tabs     TAKE these medications   acetaminophen 325 MG tablet Commonly known as: Tylenol Take 2 tablets (650 mg total) by mouth every 4 (four) hours as needed (for pain scale < 4).   Blood Pressure Monitor Kit 1 Device by Does not apply route once a week. To be monitored Regularly at home.   ibuprofen 600 MG tablet Commonly known as: ADVIL Take 1 tablet (600 mg total) by mouth every 6 (six)  hours.        Discharge home in stable condition Infant Feeding: Bottle Infant Disposition:home with mother Discharge instruction: per After Visit Summary and Postpartum booklet. Activity: Advance as tolerated. Pelvic rest for 6 weeks.  Diet: routine diet Future Appointments: Future Appointments  Date Time Provider [place]  12/15/2019 10:00 AM Griffin Basil, MD [place] None  12/15/2019 11:15 AM [facility] NURSE [facility]  12/15/2019 11:30 AM [facility] US3 [facility] [place]   Follow up Visit: Message sent to [facility] 12/09/19.   Please schedule this patient for a In person postpartum visit in 4 weeks with the following provider: Any provider. Additional Postpartum F/U:n/a  Low risk pregnancy complicated by: n/a Delivery mode:  Vaginal, Spontaneous  Anticipated Birth Control:  POPs   50/93/2671 Arrie Senate, MD

## 2019-12-10 ENCOUNTER — Encounter (HOSPITAL_COMMUNITY): Payer: Self-pay | Admitting: *Deleted

## 2019-12-10 MED ORDER — IBUPROFEN 600 MG PO TABS
600.0000 mg | ORAL_TABLET | Freq: Four times a day (QID) | ORAL | 0 refills | Status: DC
Start: 2019-12-10 — End: 2023-07-12

## 2019-12-10 MED ORDER — NORETHINDRONE 0.35 MG PO TABS: 1.0000 | ORAL_TABLET | Freq: Every day | ORAL | 11 refills | Status: DC

## 2019-12-10 MED ORDER — ACETAMINOPHEN 325 MG PO TABS: 650.0000 mg | ORAL_TABLET | ORAL | Status: DC | PRN

## 2019-12-13 ENCOUNTER — Telehealth: Payer: Self-pay

## 2019-12-13 NOTE — Telephone Encounter (Signed)
Transition Care Management Follow-up Telephone Call  Date of discharge and from where: 12/10/2019 [facility]  How have you been since you were released from the hospital? Having some low abdominal pain, patient stated it did just start last night. Advised patient to contact nurse at [facility]. Patient verbally agreed and understood.   Any questions or concerns? Yes  Items Reviewed:  Did the pt receive and understand the discharge instructions provided? Yes   Medications obtained and verified? Yes   Other? No   Any new allergies since your discharge? No   Dietary orders reviewed? Yes  Do you have support at home? Yes   Home Care and Equipment/Supplies: Were home health services ordered? no If so, what is the name of the agency? na  Has the agency set up a time to come to the patient's home? not applicable Were any new equipment or medical supplies ordered?  No What is the name of the medical supply agency? na Were you able to get the supplies/equipment? not applicable Do you have any questions related to the use of the equipment or supplies? No  Functional Questionnaire: (I = Independent and D = Dependent) ADLs: I  Bathing/Dressing- I  Meal Prep- I  Eating- I  Maintaining continence- I  Transferring/Ambulation- I  Managing Meds- I  Follow up appointments reviewed:   PCP Hospital f/u appt confirmed? Yes  Scheduled to see Thad Ranger, NP on 12/24/2019 @ 9:20am.  Specialist Hospital f/u appt confirmed? Yes  Scheduled to see Leroy Libman, MD  on 01/12/2020 @ 2:30pm.  Are transportation arrangements needed? No   If their condition worsens, is the pt aware to call PCP or go to the Emergency Dept.? Yes  Was the patient provided with contact information for the PCP's office or ED? Yes  Was to pt encouraged to call back with questions or concerns? Yes

## 2019-12-15 ENCOUNTER — Ambulatory Visit: Payer: Medicaid Other

## 2019-12-15 ENCOUNTER — Encounter: Payer: Medicaid Other | Admitting: Obstetrics and Gynecology

## 2019-12-24 ENCOUNTER — Other Ambulatory Visit: Payer: Self-pay

## 2019-12-24 ENCOUNTER — Ambulatory Visit (INDEPENDENT_AMBULATORY_CARE_PROVIDER_SITE_OTHER): Payer: Medicaid Other | Admitting: Nurse Practitioner

## 2019-12-24 ENCOUNTER — Encounter: Payer: Self-pay | Admitting: Nurse Practitioner

## 2019-12-24 VITALS — BP 118/61 | HR 76 | Temp 98.2°F | Resp 18 | Ht 64.0 in | Wt 270.8 lb

## 2019-12-24 DIAGNOSIS — R21 Rash and other nonspecific skin eruption: Secondary | ICD-10-CM | POA: Diagnosis not present

## 2019-12-24 DIAGNOSIS — Z7689 Persons encountering health services in other specified circumstances: Secondary | ICD-10-CM | POA: Diagnosis not present

## 2019-12-24 LAB — POCT URINALYSIS DIP (CLINITEK)
Bilirubin, UA: NEGATIVE
Glucose, UA: NEGATIVE mg/dL
Ketones, POC UA: NEGATIVE mg/dL
Nitrite, UA: NEGATIVE
POC PROTEIN,UA: NEGATIVE
Spec Grav, UA: 1.025 (ref 1.010–1.025)
Urobilinogen, UA: 0.2 E.U./dL
pH, UA: 6 (ref 5.0–8.0)

## 2019-12-24 LAB — POCT GLYCOSYLATED HEMOGLOBIN (HGB A1C): Hemoglobin A1C: 5.3 % (ref 4.0–5.6)

## 2019-12-24 NOTE — Progress Notes (Signed)
[place], [place]  [address] Phone:  [phone]   Fax:  [phone]   New Patient Office Visit  Subjective:  Patient ID: Alexandra Raymond, female    DOB: 02-08-1990  Age: 29 y.o. MRN: [id number]  CC:  Chief Complaint  Patient presents with   Establish Care    HPI Alexandra Raymond presents to establish care. She  has a past medical history of Headache, History of preterm labor, Irregular menstrual cycle (03/03/2013), and PCOS (polycystic ovarian syndrome).   Denies headache, dizziness, visual changes, shortness of breath, dyspnea on exertion, chest pain, nausea, vomiting or any edema.   Rash Patient presents for evaluation of a rash involving the face. Rash started several days ago. Lesions are pink-red, and raised in texture. Rash has changed over time. Rash is pruritic. Associated symptoms: none. Patient denies: abdominal pain, arthralgia, congestion, cough, crankiness, decrease in appetite, decrease in energy level, fever, headache, irritability, myalgia, nausea, sore throat and vomiting. Patient has not had contacts with similar rash. Patient has not had new exposures (soaps, lotions, laundry detergents, foods, medications, plants, insects or animals). She feels like it may be post-partum rash        Past Medical History:  Diagnosis Date   Headache    History of preterm labor    Irregular menstrual cycle 03/03/2013   PCOS (polycystic ovarian syndrome)     Past Surgical History:  Procedure Laterality Date   BACK SURGERY     cystectomy     WISDOM TOOTH EXTRACTION      Family History  Problem Relation Age of Onset   Diabetes Mother     Social History   Socioeconomic History   Marital status: Married    Spouse name: Not on file   Number of children: Not on file   Years of education: Not on file   Highest education level: Not on file  Occupational History   Not on file  Tobacco Use   Smoking  status: Never Smoker   Smokeless tobacco: Never Used  Vaping Use   Vaping Use: Never used  Substance and Sexual Activity   Alcohol use: No    Alcohol/week: 0.0 standard drinks   Drug use: No   Sexual activity: Yes    Partners: Male    Comment: Pregnant   Other Topics Concern   Not on file  Social History Narrative   Not on file   Social Determinants of Health   Financial Resource Strain: Not on file  Food Insecurity: Not on file  Transportation Needs: Not on file  Physical Activity: Not on file  Stress: Not on file  Social Connections: Not on file  Intimate Partner Violence: Not on file    ROS Review of Systems  Constitutional: Negative.   HENT: Negative.   Eyes: Negative.   Respiratory: Negative.   Cardiovascular: Negative.   Gastrointestinal: Negative.   Endocrine: Negative.   Genitourinary: Negative.   Musculoskeletal: Negative.   Skin: Negative.   Allergic/Immunologic: Negative.   Neurological: Negative.   Hematological: Negative.   Psychiatric/Behavioral: Negative.     Objective:   Today's Vitals: BP 118/61 (BP Location: Left Arm, Patient Position: Sitting, Cuff Size: Large)    Pulse 76    Temp 98.2 F (36.8 C) (Temporal)    Resp 18    Ht _0  (1.626 m)    Wt 270 lb 12.8 oz (122.8 kg)    SpO2 98%    BMI  46.48 kg/m   Physical Exam Constitutional:      General: She is not in acute distress.    Appearance: She is obese. She is not ill-appearing, toxic-appearing or diaphoretic.  HENT:     Head: Normocephalic and atraumatic.  Cardiovascular:     Rate and Rhythm: Normal rate and regular rhythm.     Pulses: Normal pulses.     Heart sounds: Normal heart sounds.  Pulmonary:     Effort: Pulmonary effort is normal.     Breath sounds: Normal breath sounds.  Abdominal:     General: Bowel sounds are normal.     Palpations: Abdomen is soft.  Musculoskeletal:        General: Normal range of motion.     Cervical back: Normal range of motion.  Skin:     General: Skin is warm.     Capillary Refill: Capillary refill takes less than 2 seconds.     Findings: Erythema and rash present.       Neurological:     General: No focal deficit present.     Mental Status: She is alert and oriented to person, place, and time.  Psychiatric:        Mood and Affect: Mood normal.        Behavior: Behavior normal.        Thought Content: Thought content normal.        Judgment: Judgment normal.     Assessment & Plan:   Problem List Items Addressed This Visit   None   Visit Diagnoses    Encounter to establish care    -  Primary   Relevant Orders   HgB A1c (Completed)   POCT URINALYSIS DIP (CLINITEK) (Completed)   Facial rash     She will continue with current treatment  Consider low dose hydrocortisone cream if needed Discussed precautions.       Outpatient Encounter Medications as of 12/24/2019  Medication Sig   acetaminophen (TYLENOL) 325 MG tablet Take 2 tablets (650 mg total) by mouth every 4 (four) hours as needed (for pain scale < 4).   ibuprofen (ADVIL) 600 MG tablet Take 1 tablet (600 mg total) by mouth every 6 (six) hours.   norethindrone (ORTHO MICRONOR) 0.35 MG tablet Take 1 tablet (0.35 mg total) by mouth daily.   Blood Pressure Monitor KIT 1 Device by Does not apply route once a week. To be monitored Regularly at home. (Patient not taking: Reported on 12/24/2019)   No facility-administered encounter medications on file as of 12/24/2019.    Follow-up: Return in about 1 year (around 12/23/2020).   [place], [place]

## 2019-12-24 NOTE — Patient Instructions (Addendum)
Health Maintenance, Female Adopting a healthy lifestyle and getting preventive care are important in promoting health and wellness. Ask your health care provider about:  The right schedule for you to have regular tests and exams.  Things you can do on your own to prevent diseases and keep yourself healthy. What should I know about diet, weight, and exercise? Eat a healthy diet   Eat a diet that includes plenty of vegetables, fruits, low-fat dairy products, and lean protein.  Do not eat a lot of foods that are high in solid fats, added sugars, or sodium. Maintain a healthy weight Body mass index (BMI) is used to identify weight problems. It estimates body fat based on height and weight. Your health care provider can help determine your BMI and help you achieve or maintain a healthy weight. Get regular exercise Get regular exercise. This is one of the most important things you can do for your health. Most adults should:  Exercise for at least 150 minutes each week. The exercise should increase your heart rate and make you sweat (moderate-intensity exercise).  Do strengthening exercises at least twice a week. This is in addition to the moderate-intensity exercise.  Spend less time sitting. Even light physical activity can be beneficial. Watch cholesterol and blood lipids Have your blood tested for lipids and cholesterol at 28 years of age, then have this test every 5 years. Have your cholesterol levels checked more often if:  Your lipid or cholesterol levels are high.  You are older than 28 years of age.  You are at high risk for heart disease. What should I know about cancer screening? Depending on your health history and family history, you may need to have cancer screening at various ages. This may include screening for:  Breast cancer.  Cervical cancer.  Colorectal cancer.  Skin cancer.  Lung cancer. What should I know about heart disease, diabetes, and high blood  pressure? Blood pressure and heart disease  High blood pressure causes heart disease and increases the risk of stroke. This is more likely to develop in people who have high blood pressure readings, are of African descent, or are overweight.  Have your blood pressure checked: ? Every 3-5 years if you are 18-39 years of age. ? Every year if you are 40 years old or older. Diabetes Have regular diabetes screenings. This checks your fasting blood sugar level. Have the screening done:  Once every three years after age 40 if you are at a normal weight and have a low risk for diabetes.  More often and at a younger age if you are overweight or have a high risk for diabetes. What should I know about preventing infection? Hepatitis B If you have a higher risk for hepatitis B, you should be screened for this virus. Talk with your health care provider to find out if you are at risk for hepatitis B infection. Hepatitis C Testing is recommended for:  Everyone born from 1945 through 1965.  Anyone with known risk factors for hepatitis C. Sexually transmitted infections (STIs)  Get screened for STIs, including gonorrhea and chlamydia, if: ? You are sexually active and are younger than 28 years of age. ? You are older than 28 years of age and your health care provider tells you that you are at risk for this type of infection. ? Your sexual activity has changed since you were last screened, and you are at increased risk for chlamydia or gonorrhea. Ask your health care provider if   you are at risk.  Ask your health care provider about whether you are at high risk for HIV. Your health care provider may recommend a prescription medicine to help prevent HIV infection. If you choose to take medicine to prevent HIV, you should first get tested for HIV. You should then be tested every 3 months for as long as you are taking the medicine. Pregnancy  If you are about to stop having your period (premenopausal) and  you may become pregnant, seek counseling before you get pregnant.  Take 400 to 800 micrograms (mcg) of folic acid every day if you become pregnant.  Ask for birth control (contraception) if you want to prevent pregnancy. Osteoporosis and menopause Osteoporosis is a disease in which the bones lose minerals and strength with aging. This can result in bone fractures. If you are 22 years old or older, or if you are at risk for osteoporosis and fractures, ask your health care provider if you should:  Be screened for bone loss.  Take a calcium or vitamin D supplement to lower your risk of fractures.  Be given hormone replacement therapy (HRT) to treat symptoms of menopause. Follow these instructions at home: Lifestyle  Do not use any products that contain nicotine or tobacco, such as cigarettes, e-cigarettes, and chewing tobacco. If you need help quitting, ask your health care provider.  Do not use street drugs.  Do not share needles.  Ask your health care provider for help if you need support or information about quitting drugs. Alcohol use  Do not drink alcohol if: ? Your health care provider tells you not to drink. ? You are pregnant, may be pregnant, or are planning to become pregnant.  If you drink alcohol: ? Limit how much you use to 0-1 drink a day. ? Limit intake if you are breastfeeding.  Be aware of how much alcohol is in your drink. In the [place] one drink equals one 12 oz bottle of beer (355 mL), one 5 oz glass of wine (148 mL), or one 1 oz glass of hard liquor (44 mL). General instructions  Schedule regular health, dental, and eye exams.  Stay current with your vaccines.  Tell your health care provider if: ? You often feel depressed. ? You have ever been abused or do not feel safe at home. Summary  Adopting a healthy lifestyle and getting preventive care are important in promoting health and wellness.  Follow your health care provider's instructions about healthy  diet, exercising, and getting tested or screened for diseases.  Follow your health care provider's instructions on monitoring your cholesterol and blood pressure. This information is not intended to replace advice given to you by your health care provider. Make sure you discuss any questions you have with your health care provider. Document Revised: 12/24/2017 Document Reviewed: 12/24/2017 Elsevier Patient Education  2020 Elsevier Inc. '  Rash, Adult  A rash is a change in the color of your skin. A rash can also change the way your skin feels. There are many different conditions and factors that can cause a rash. Follow these instructions at home: The goal of treatment is to stop the itching and keep the rash from spreading. Watch for any changes in your symptoms. Let your doctor know about them. Follow these instructions to help with your condition: Medicine Take or apply over-the-counter and prescription medicines only as told by your doctor. These may include medicines:  To treat red or swollen skin (corticosteroid creams).  To treat  itching.  To treat an allergy (oral antihistamines).  To treat very bad symptoms (oral corticosteroids).  Skin care  Put cool cloths (compresses) on the affected areas.  Do not scratch or rub your skin.  Avoid covering the rash. Make sure that the rash is exposed to air as much as possible. Managing itching and discomfort  Avoid hot showers or baths. These can make itching worse. A cold shower may help.  Try taking a bath with: ? Epsom salts. You can get these at your local pharmacy or grocery store. Follow the instructions on the package. ? Baking soda. Pour a small amount into the bath as told by your doctor. ? Colloidal oatmeal. You can get this at your local pharmacy or grocery store. Follow the instructions on the package.  Try putting baking soda paste onto your skin. Stir water into baking soda until it gets like a paste.  Try putting  on a lotion that relieves itchiness (calamine lotion).  Keep cool and out of the sun. Sweating and being hot can make itching worse. General instructions   Rest as needed.  Drink enough fluid to keep your pee (urine) pale yellow.  Wear loose-fitting clothing.  Avoid scented soaps, detergents, and perfumes. Use gentle soaps, detergents, perfumes, and other cosmetic products.  Avoid anything that causes your rash. Keep a journal to help track what causes your rash. Write down: ? What you eat. ? What cosmetic products you use. ? What you drink. ? What you wear. This includes jewelry.  Keep all follow-up visits as told by your doctor. This is important. Contact a doctor if:  You sweat at night.  You lose weight.  You pee (urinate) more than normal.  You pee less than normal, or you notice that your pee is a darker color than normal.  You feel weak.  You throw up (vomit).  Your skin or the whites of your eyes look yellow (jaundice).  Your skin: ? Tingles. ? Is numb.  Your rash: ? Does not go away after a few days. ? Gets worse.  You are: ? More thirsty than normal. ? More tired than normal.  You have: ? New symptoms. ? Pain in your belly (abdomen). ? A fever. ? Watery poop (diarrhea). Get help right away if:  You have a fever and your symptoms suddenly get worse.  You start to feel mixed up (confused).  You have a very bad headache or a stiff neck.  You have very bad joint pains or stiffness.  You have jerky movements that you cannot control (seizure).  Your rash covers all or most of your body. The rash may or may not be painful.  You have blisters that: ? Are on top of the rash. ? Grow larger. ? Grow together. ? Are painful. ? Are inside your nose or mouth.  You have a rash that: ? Looks like purple pinprick-sized spots all over your body. ? Has a "bull's eye" or looks like a target. ? Is red and painful, causes your skin to peel, and is not  from being in the sun too long. Summary  A rash is a change in the color of your skin. A rash can also change the way your skin feels.  The goal of treatment is to stop the itching and keep the rash from spreading.  Take or apply over-the-counter and prescription medicines only as told by your doctor.  Contact a doctor if you have new symptoms or symptoms that  get worse.  Keep all follow-up visits as told by your doctor. This is important. This information is not intended to replace advice given to you by your health care provider. Make sure you discuss any questions you have with your health care provider. Document Revised: 04/24/2018 Document Reviewed: 08/04/2017 Elsevier Patient Education  2020 [employer].

## 2020-01-12 ENCOUNTER — Encounter: Payer: Self-pay | Admitting: Obstetrics & Gynecology

## 2020-01-12 ENCOUNTER — Ambulatory Visit (INDEPENDENT_AMBULATORY_CARE_PROVIDER_SITE_OTHER): Payer: Medicaid Other | Admitting: Obstetrics & Gynecology

## 2020-01-12 ENCOUNTER — Other Ambulatory Visit: Payer: Self-pay

## 2020-01-12 DIAGNOSIS — O0990 Supervision of high risk pregnancy, unspecified, unspecified trimester: Secondary | ICD-10-CM

## 2020-01-12 DIAGNOSIS — Z8751 Personal history of pre-term labor: Secondary | ICD-10-CM

## 2020-01-12 DIAGNOSIS — O99210 Obesity complicating pregnancy, unspecified trimester: Secondary | ICD-10-CM

## 2020-01-12 DIAGNOSIS — Z6841 Body Mass Index (BMI) 40.0 and over, adult: Secondary | ICD-10-CM

## 2020-01-12 NOTE — Progress Notes (Signed)
    Post Partum Visit Note  Alexandra Raymond is a 29 y.o. G39P1102 female who presents for a postpartum visit. She is 4 weeks postpartum following a normal spontaneous vaginal delivery.  I have fully reviewed the prenatal and intrapartum course. The delivery was at [redacted]w[redacted]d gestational weeks.  Anesthesia: none. Postpartum course has been unremarkable. Baby is doing well. Baby is feeding by both breast and bottle - Alexandra Raymond . Bleeding no bleeding. Bowel function is normal. Bladder function is normal. Patient is not sexually active. Contraception method is oral progesterone-only contraceptive given at discharge. Postpartum depression screening: EPDS = 0.   The pregnancy intention screening data noted above was reviewed. Potential methods of contraception were discussed. The patient elected to proceed with Oral Contraceptive.   The following portions of the patient's history were reviewed and updated as appropriate: allergies, current medications, past family history, past medical history, past social history, past surgical history and problem list.  Review of Systems Pertinent items are noted in HPI.    Objective:  There were no vitals taken for this visit.  BP 136/83   Pulse 83   Wt 276 lb (125.2 kg)   BMI 47.38 kg/m   CONSTITUTIONAL: Well-developed, well-nourished female in no acute distress.  HENT:  Normocephalic, atraumatic EYES: Conjunctivae and EOM are normal. No scleral icterus.  NECK: Normal range of motion SKIN: Skin is warm and dry. No rash noted. Not diaphoretic.No pallor. NEUROLGIC: Alert and oriented to person, place, and time. Normal coordination.   Assessment:   4 week postpartum exam. Pap smear not done at today's visit.   Plan:   Essential components of care per ACOG recommendations:  1.  Mood and well being: Patient with negative depression screening today. Reviewed local resources for support.  - Patient does not use tobacco.  2. Infant care and feeding:   -Patient currently breastmilk feeding? Yes Pt is at home with infant. Breast milk with formula supplementation. -Social determinants of health (SDOH) reviewed in EPIC. No concerns  3. Sexuality, contraception and birth spacing - Patient does not want a pregnancy in the next year.  Desired family size is 4 children.  - Reviewed forms of contraception in tiered fashion. Patient desired oral progesterone-only contraceptive today.   - Discussed birth spacing of 18 months  4. Sleep and fatigue -Encouraged family/partner/community support of 4 hrs of uninterrupted sleep to help with mood and fatigue  5. Physical Recovery  - Discussed patients delivery. There were no complications  6.  Health Maintenance - Last pap smear done 10/15/2018 and was normal with negative HPV.  Catha Ontko L. Harraway-Smith, M.D., [facility]

## 2020-02-11 ENCOUNTER — Other Ambulatory Visit: Payer: Self-pay | Admitting: Obstetrics & Gynecology

## 2020-02-11 DIAGNOSIS — Z3009 Encounter for other general counseling and advice on contraception: Secondary | ICD-10-CM

## 2020-02-11 MED ORDER — NORETHINDRONE ACET-ETHINYL EST 1-20 MG-MCG PO TABS: 1.0000 | ORAL_TABLET | Freq: Every day | ORAL | 11 refills | Status: DC

## 2020-12-22 ENCOUNTER — Ambulatory Visit: Payer: Medicaid Other | Admitting: Nurse Practitioner

## 2022-01-23 IMAGING — US US MFM OB FOLLOW-UP
1 series · 13 of 28 positions shown · non-contrast
Comparison: none

[Series 1: us mfm ob follow-up · 32 acquisitions, 13 frames shown]
[im 2/32]
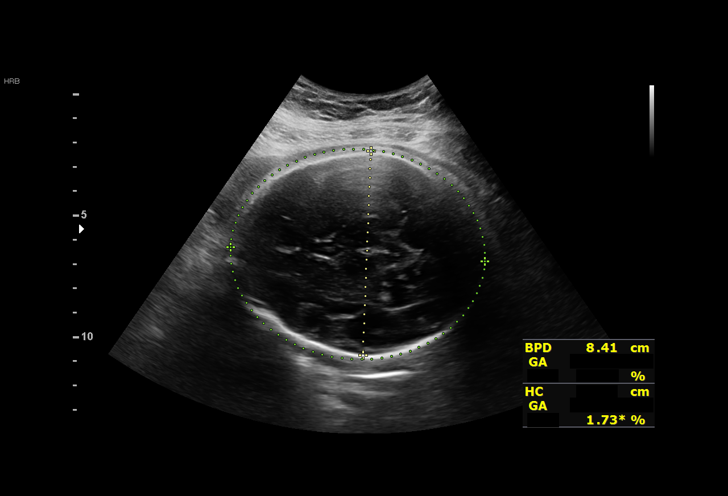
[im 4/32]
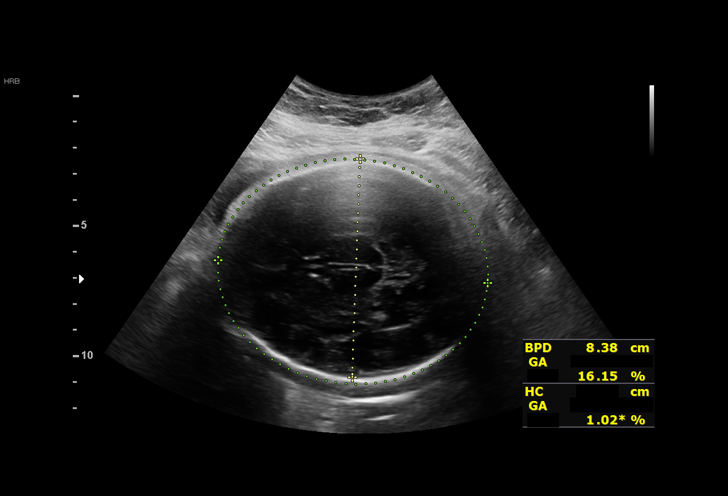
[im 6/32]
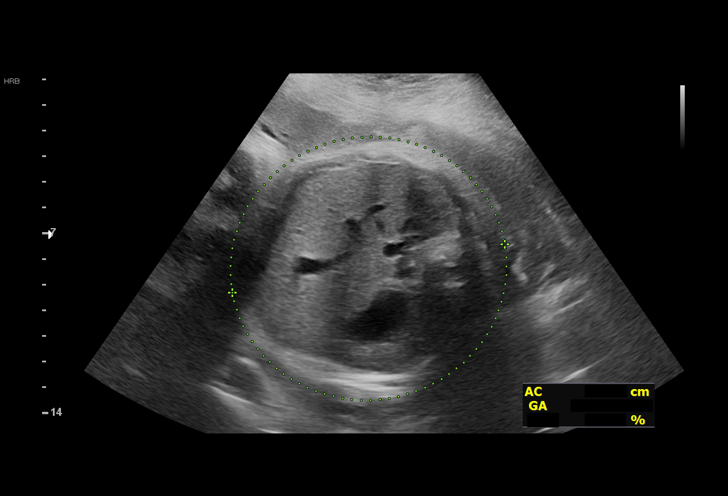
[im 9/32]
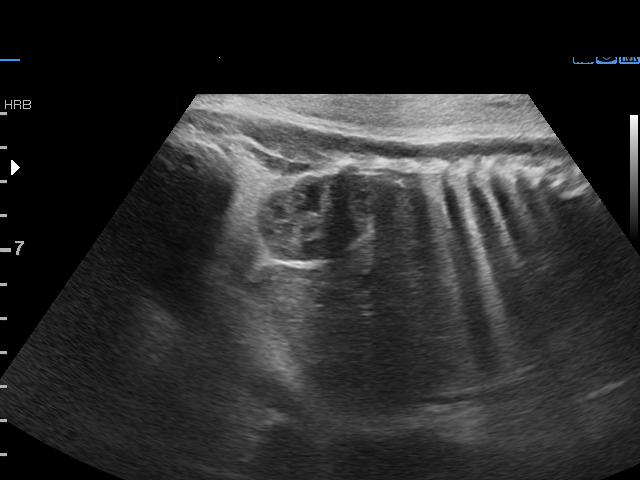
[im 11/32]
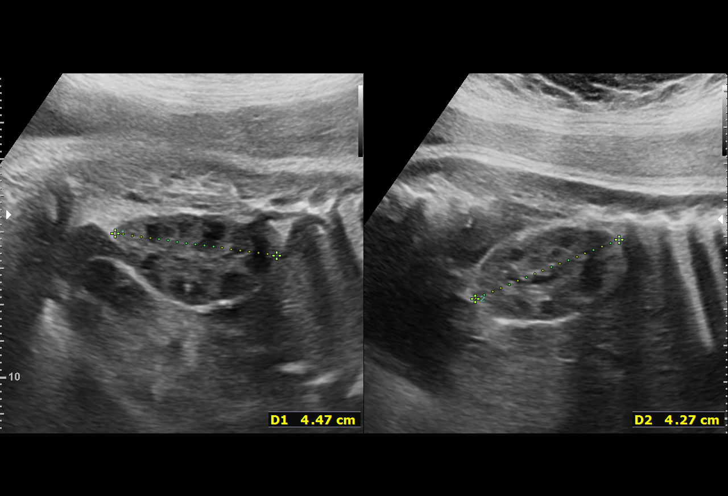
[im 13/32]
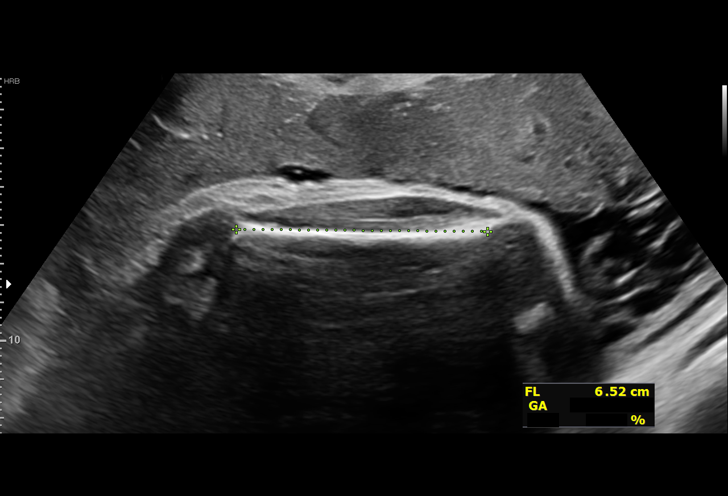
[im 17/32]
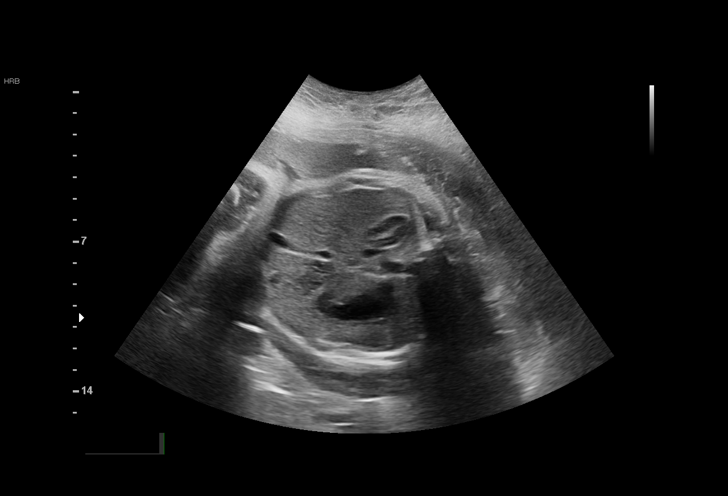
[im 19/32]
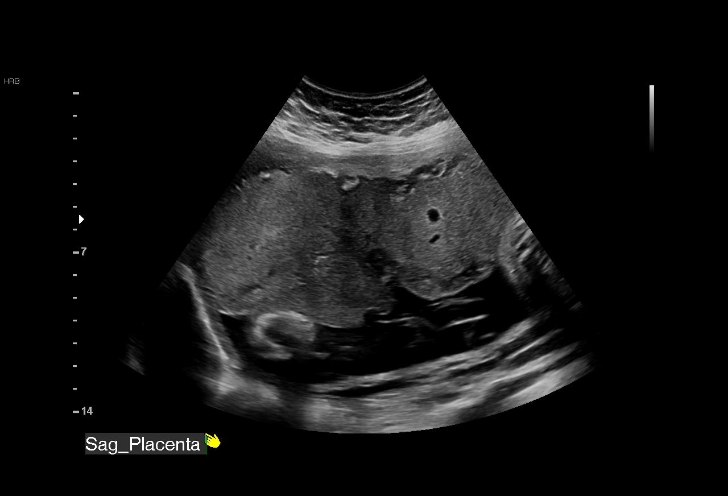
[im 21/32]
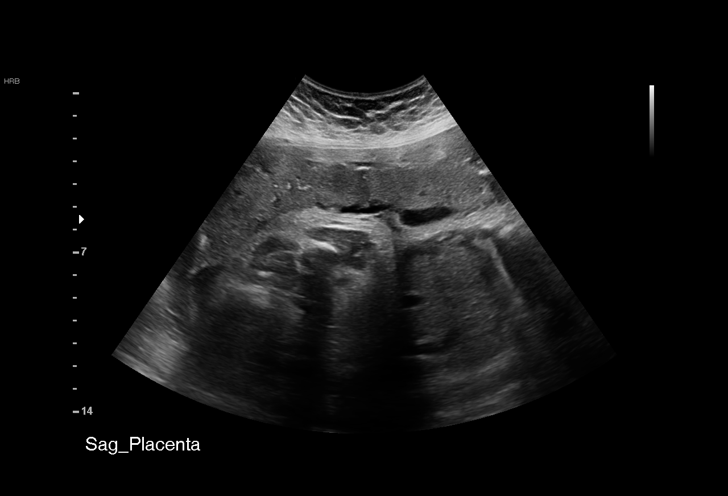
[im 23/32]
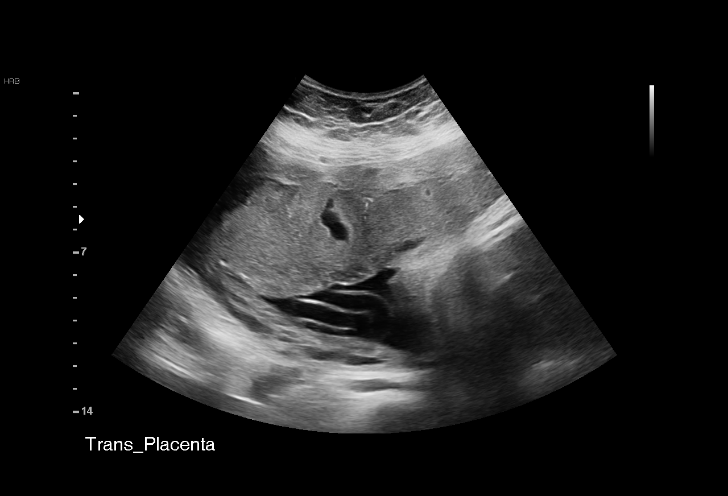
[im 26/32]
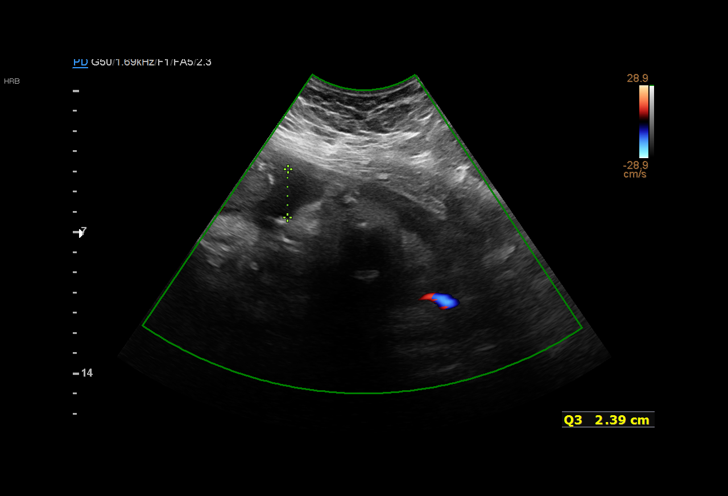
[im 28/32]
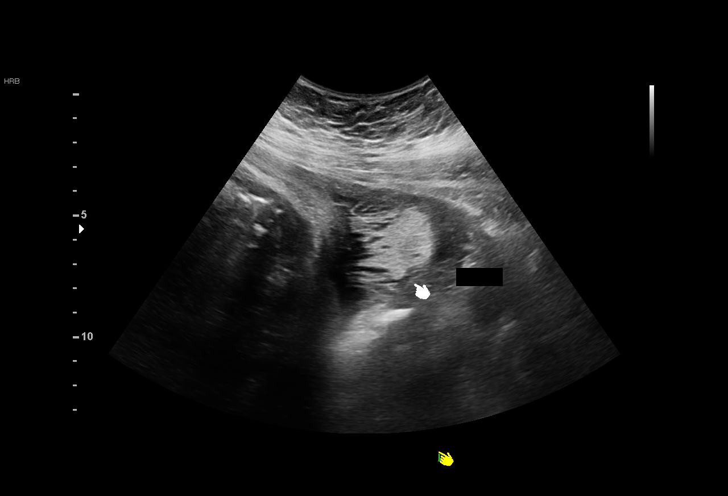
[im 30/32]
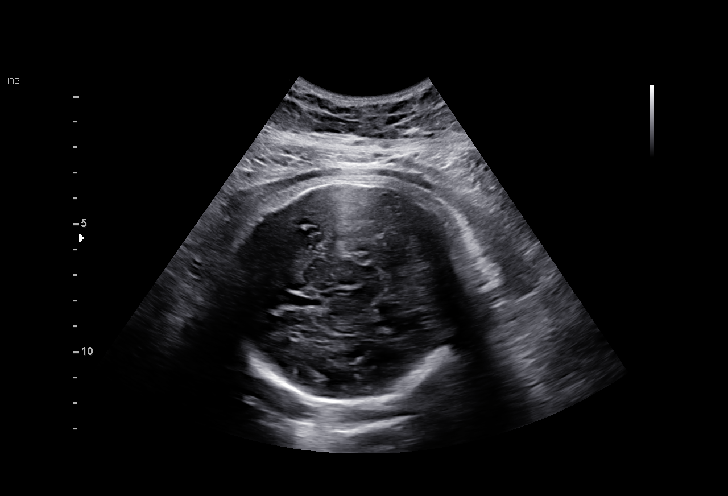

[13 of 28 positions shown; findings below may reference images not displayed]

CNM

Indications

 Maternal morbid obesity (BMI 48)
 Encounter for other antenatal screening
 follow-up
 Medical complication of pregnancy (PCOS)
 Poor obstetric history: Previous preterm
 delivery, @29w then term [place] - 17P
 35 weeks gestation of pregnancy
Vital Signs

                                                Height:        5'4"
Fetal Evaluation

 Num Of Fetuses:         1
 Fetal Heart Rate(bpm):  137
 Cardiac Activity:       Observed
 Presentation:           Cephalic
 Placenta:               Anterior
 P. Cord Insertion:      Previously Visualized

 Amniotic Fluid
 AFI FV:      Subjectively low-normal

 AFI Sum(cm)     %Tile       Largest Pocket(cm)
 8.58            10

 RUQ(cm)       RLQ(cm)       LUQ(cm)        LLQ(cm)
 2.25          3.94          0
Biometry

 BPD:      84.3  mm     G. Age:  34w 0d         20  %    CI:        78.38   %    70 - 86
                                                         FL/HC:      21.6   %    20.1 -
 HC:      301.2  mm     G. Age:  33w 3d        1.5  %    HC/AC:      0.91        0.93 -
 AC:       330   mm     G. Age:  36w 6d         94  %    FL/BPD:     77.2   %    71 - 87
 FL:       65.1  mm     G. Age:  33w 4d         10  %    FL/AC:      19.7   %    20 - 24

 LV:        5.5  mm

 Est. FW:    6259  gm    5 lb 14 oz      54  %
OB History

 Gravidity:    3         Term:   1        Prem:   1
 Living:       2
Gestational Age

 LMP:           35w 1d        Date:  03/10/19                 EDD:   12/15/19
 U/S Today:     34w 3d                                        EDD:   12/20/19
 Best:          35w 1d     Det. By:  LMP  (03/10/19)          EDD:   12/15/19
Anatomy

 Cranium:               Appears normal         Aortic Arch:            Not well visualized
 Cavum:                 Appears normal         Ductal Arch:            Previously seen
 Ventricles:            Appears normal         Diaphragm:              Previously seen
 Choroid Plexus:        Previously seen        Stomach:                Appears normal, left
                                                                       sided
 Cerebellum:            Previously seen        Abdomen:                Appears normal
 Posterior Fossa:       Previously seen        Abdominal Wall:         Previously seen
 Nuchal Fold:           Previously seen        Cord Vessels:           Previously seen
 Face:                  Orbits and profile     Kidneys:                Appear normal
                        previously seen
 Lips:                  Previously seen        Bladder:                Appears normal
 Thoracic:              Appears normal         Spine:                  Limited views
                                                                       previously seen
 Heart:                 Previously seen        Upper Extremities:      Previously seen
 RVOT:                  Previously seen        Lower Extremities:      Previously seen
 LVOT:                  Previously seen

 Other:  Heels and 5th digit prev visualized. Nasal bone prev visualized. Open
         hands prev visualized. LImited views of spine prev seen.  Technically
         difficult due to maternal habitus and fetal position.
Cervix Uterus Adnexa

 Cervix
 Not visualized (advanced GA >89wks)
Comments

 This patient was seen for a follow up growth scan due to
 maternal obesity.  She reports that she has screened
 negative for gestational diabetes in her current pregnancy
 and denies any problems since her last exam.
 She was informed that the fetal growth and amniotic fluid
 level appears appropriate for her gestational age.
 As her BMI is greater than 40, we will start weekly fetal
 testing at around 36 weeks.
 A biophysical profile scheduled in 1 week.

## 2022-02-06 IMAGING — US US MFM FETAL BPP W/O NON-STRESS
1 series · 15 of 28 positions shown · non-contrast
Comparison: none

[Series 1: us mfm fetal bpp w/o non-stress · 37 acquisitions, 15 frames shown]
[im 1/37]
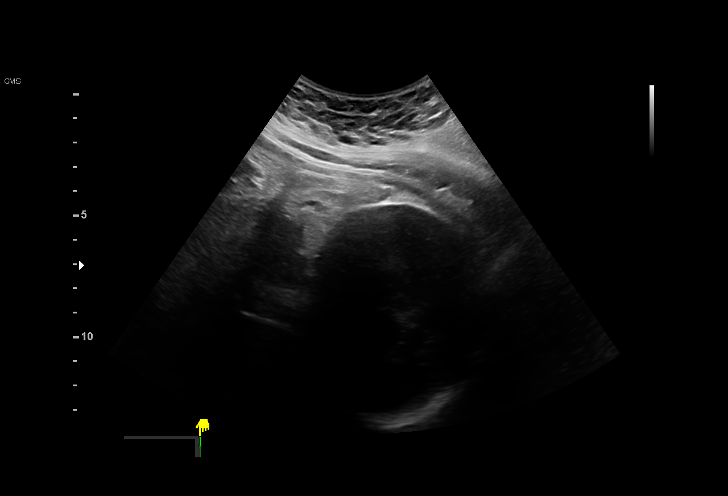
[im 3/37]
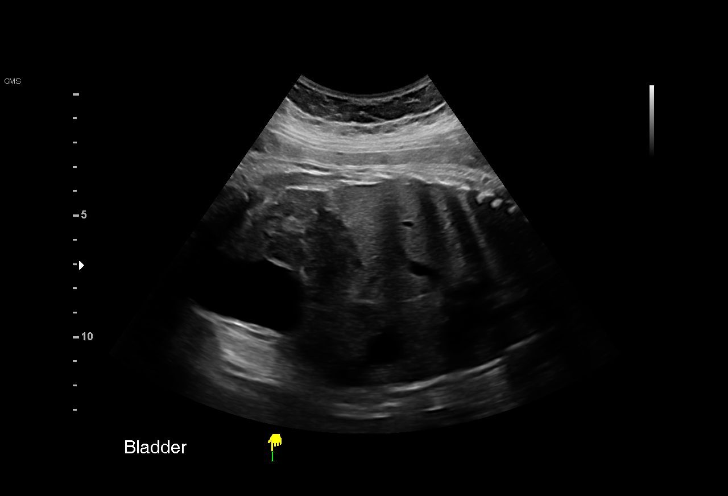
[im 6/37]
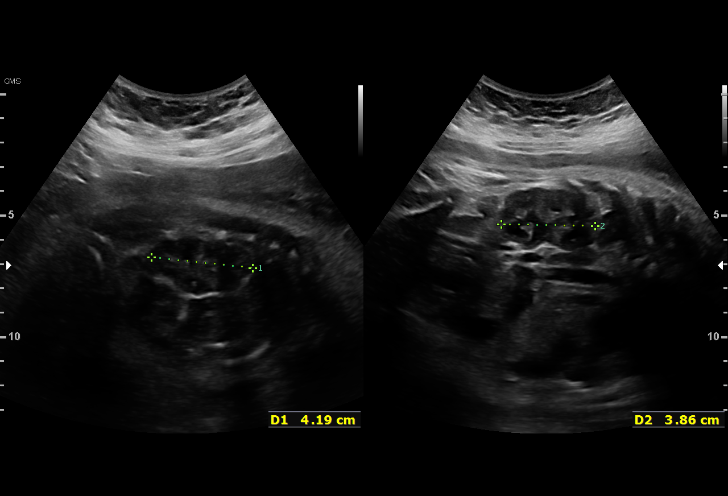
[im 9/37]
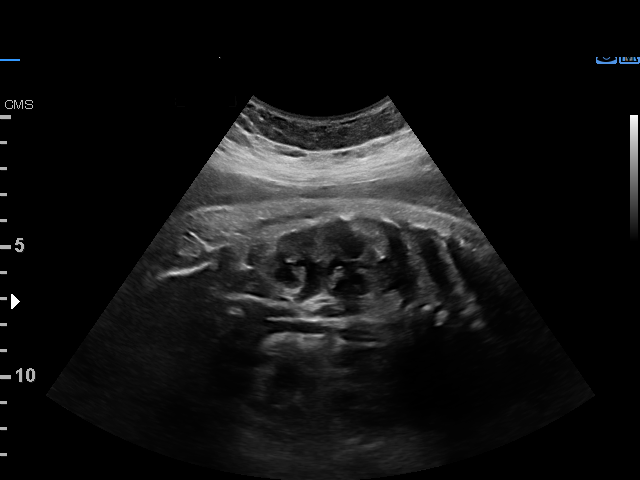
[im 11/37]
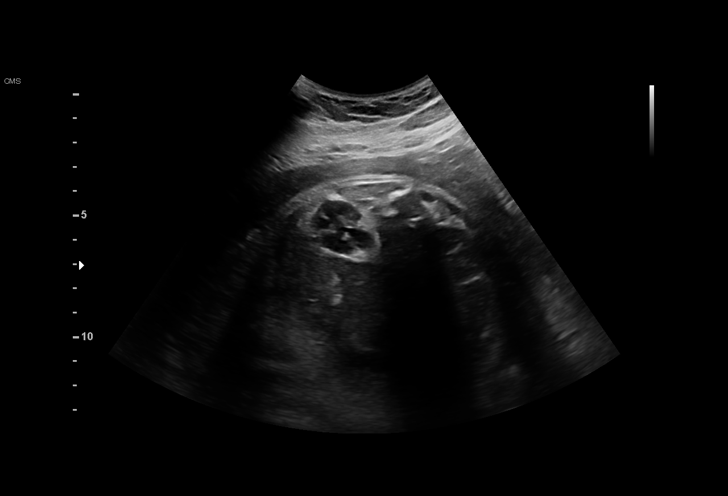
[im 14/37]
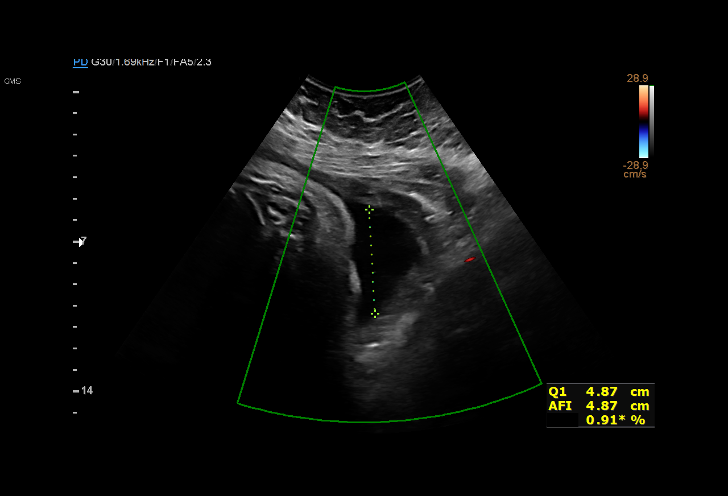
[im 17/37]
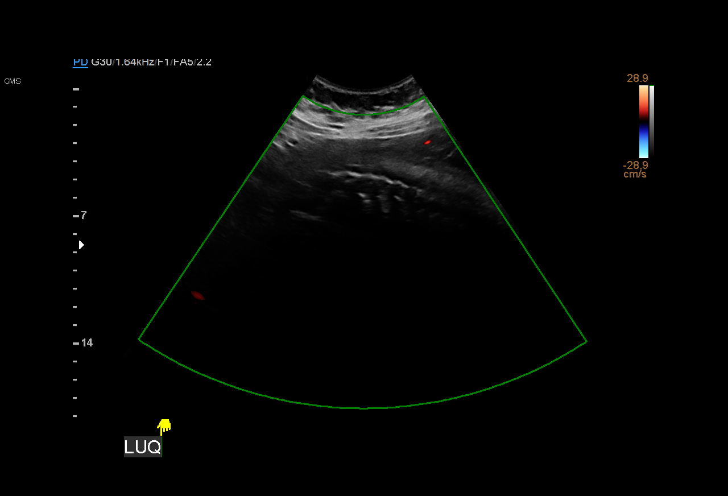
[im 19/37]
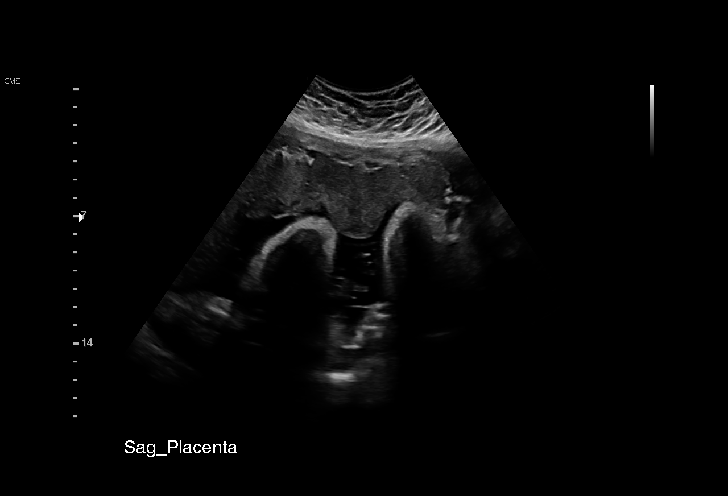
[im 21/37]
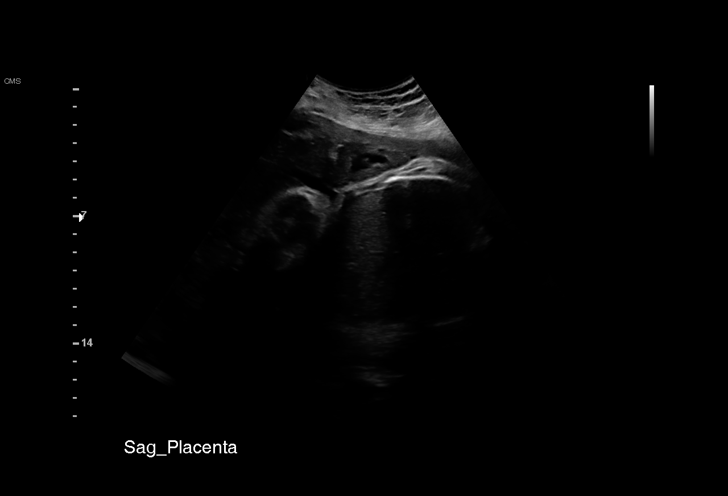
[im 23/37]
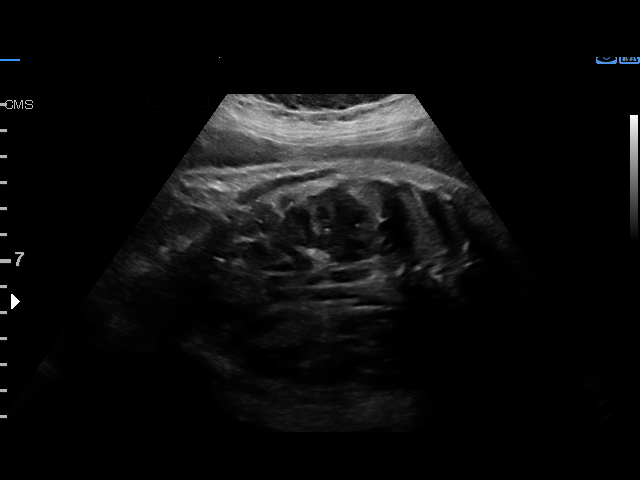
[im 26/37]
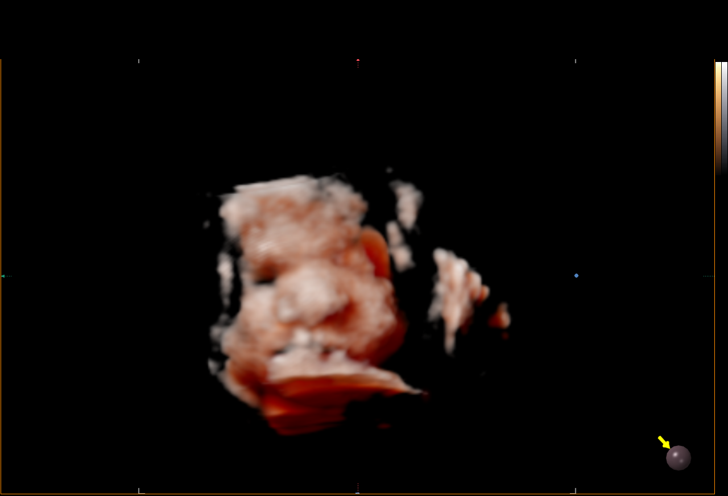
[im 29/37]
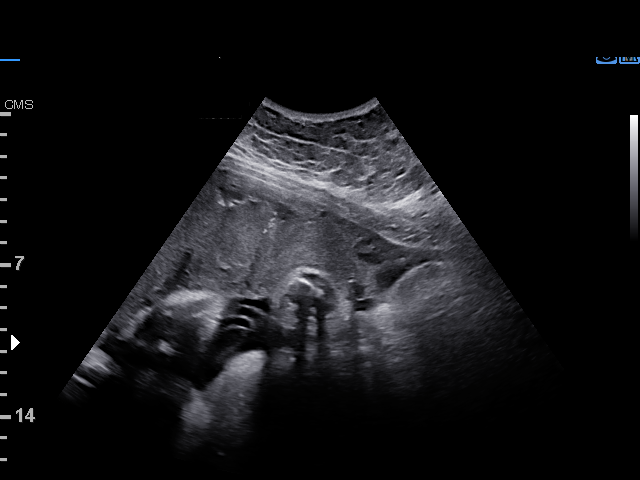
[im 31/37]
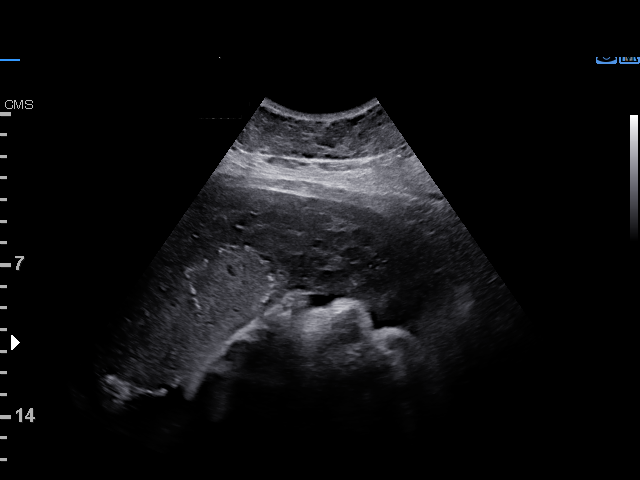
[im 34/37]
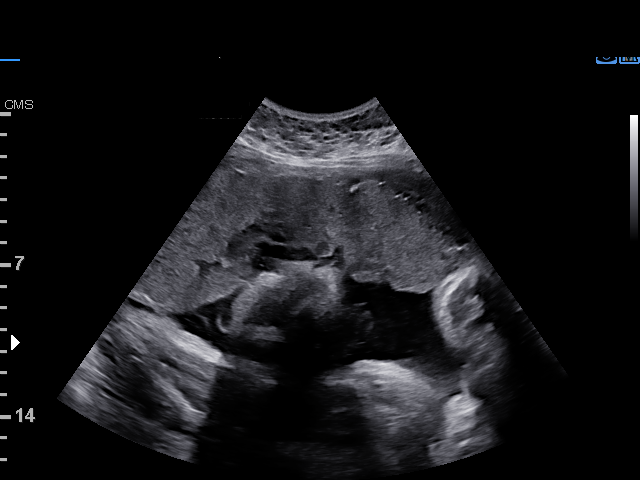
[im 37/37]
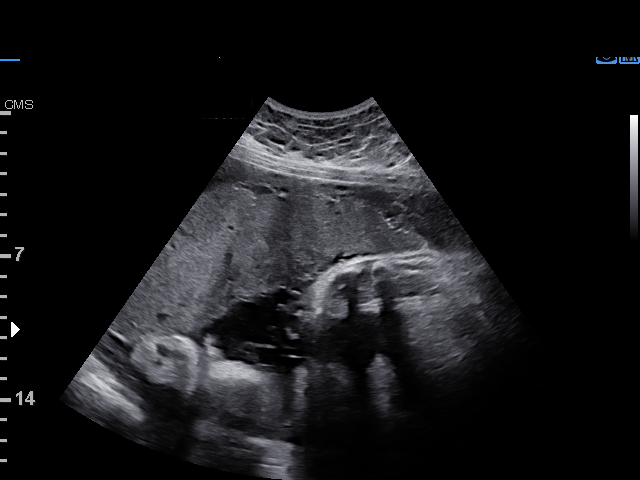

[15 of 28 positions shown; findings below may reference images not displayed]

CNM

Indications

 37 weeks gestation of pregnancy
 Maternal morbid obesity (BMI 48)
 Medical complication of pregnancy (PCOS)
 Poor obstetric history: Previous preterm
 delivery, @29w then term [place] - 17P
 Encounter for other antenatal screening
 follow-up
Vital Signs

                                                Height:        5'4"
Fetal Evaluation

 Num Of Fetuses:         1
 Fetal Heart Rate(bpm):  137
 Cardiac Activity:       Observed
 Presentation:           Cephalic
 Placenta:               Anterior
 P. Cord Insertion:      Previously Visualized

 Amniotic Fluid
 AFI FV:      Within normal limits

 AFI Sum(cm)     %Tile       Largest Pocket(cm)
 11.1            32

 RUQ(cm)                     LUQ(cm)        LLQ(cm)

Biophysical Evaluation

 Amniotic F.V:   Within normal limits       F. Tone:        Observed
 F. Movement:    Observed                   Score:          [DATE]
 F. Breathing:   Observed
OB History

 Gravidity:    3         Term:   1        Prem:   1
 Living:       2
Gestational Age

 LMP:           37w 1d        Date:  03/10/19                 EDD:   12/15/19
 Best:          37w 1d     Det. By:  LMP  (03/10/19)          EDD:   12/15/19
Comments

 This patient was seen for a biophysical profile due to
 maternal obesity with a BMI of 48.  She denies any problems
 since her last exam and reports feeling fetal movements
 throughout the day.
 A biophysical profile performed today was [DATE].
 There was normal amniotic fluid noted on today's ultrasound
 exam.
 She will return in 1 week for another biophysical profile.

## 2023-07-12 ENCOUNTER — Inpatient Hospital Stay (HOSPITAL_COMMUNITY): Payer: Self-pay

## 2023-07-12 ENCOUNTER — Inpatient Hospital Stay (HOSPITAL_COMMUNITY)
Admission: AD | Admit: 2023-07-12 | Discharge: 2023-07-12 | Disposition: A | Discharge status: Home / Self Care | Payer: Self-pay | Attending: Obstetrics and Gynecology | Admitting: Obstetrics and Gynecology

## 2023-07-12 ENCOUNTER — Encounter (HOSPITAL_COMMUNITY): Payer: Self-pay | Admitting: *Deleted

## 2023-07-12 DIAGNOSIS — R109 Unspecified abdominal pain: Secondary | ICD-10-CM | POA: Diagnosis not present

## 2023-07-12 DIAGNOSIS — B9689 Other specified bacterial agents as the cause of diseases classified elsewhere: Secondary | ICD-10-CM | POA: Diagnosis not present

## 2023-07-12 DIAGNOSIS — O26891 Other specified pregnancy related conditions, first trimester: Secondary | ICD-10-CM | POA: Diagnosis not present

## 2023-07-12 DIAGNOSIS — Z3491 Encounter for supervision of normal pregnancy, unspecified, first trimester: Secondary | ICD-10-CM

## 2023-07-12 DIAGNOSIS — O30043 Twin pregnancy, dichorionic/diamniotic, third trimester: Secondary | ICD-10-CM | POA: Diagnosis not present

## 2023-07-12 DIAGNOSIS — Z3A01 Less than 8 weeks gestation of pregnancy: Secondary | ICD-10-CM | POA: Diagnosis not present

## 2023-07-12 DIAGNOSIS — N760 Acute vaginitis: Secondary | ICD-10-CM | POA: Diagnosis not present

## 2023-07-12 LAB — CBC
HCT: 35.8 % — ABNORMAL LOW (ref 36.0–46.0)
Hemoglobin: 11.8 g/dL — ABNORMAL LOW (ref 12.0–15.0)
MCH: 30 pg (ref 26.0–34.0)
MCHC: 33 g/dL (ref 30.0–36.0)
MCV: 91.1 fL (ref 80.0–100.0)
Platelets: 206 10*3/uL (ref 150–400)
RBC: 3.93 MIL/uL (ref 3.87–5.11)
RDW: 14 % (ref 11.5–15.5)
WBC: 11.3 10*3/uL — ABNORMAL HIGH (ref 4.0–10.5)
nRBC: 0 % (ref 0.0–0.2)

## 2023-07-12 LAB — URINALYSIS, ROUTINE W REFLEX MICROSCOPIC
Bacteria, UA: NONE SEEN
Bilirubin Urine: NEGATIVE
Glucose, UA: NEGATIVE mg/dL
Hgb urine dipstick: NEGATIVE
Ketones, ur: NEGATIVE mg/dL
Nitrite: NEGATIVE
Protein, ur: NEGATIVE mg/dL
Specific Gravity, Urine: 1.005 (ref 1.005–1.030)
pH: 7 (ref 5.0–8.0)

## 2023-07-12 LAB — WET PREP, GENITAL
Sperm: NONE SEEN
Trich, Wet Prep: NONE SEEN
WBC, Wet Prep HPF POC: >=10 — AB (ref <10)
Yeast Wet Prep HPF POC: NONE SEEN

## 2023-07-12 LAB — POCT PREGNANCY, URINE: Preg Test, Ur: POSITIVE — AB

## 2023-07-12 LAB — HCG, QUANTITATIVE, PREGNANCY: hCG, Beta Chain, Quant, S: 11375 m[IU]/mL — ABNORMAL HIGH (ref <5)

## 2023-07-12 MED ORDER — METRONIDAZOLE 500 MG PO TABS: 500.0000 mg | ORAL_TABLET | Freq: Two times a day (BID) | ORAL | 0 refills | Status: AC

## 2023-07-12 NOTE — MAU Note (Addendum)
 Pt says she has lower abd and lower back pain - started Wed   3/10 But now pain is worse - 8/10 No meds for pain  No VB Last sex-  2 weeks ago  Had preg confirmed at [facility]

## 2023-07-15 LAB — GC/CHLAMYDIA PROBE AMP (~~LOC~~) NOT AT ARMC
Chlamydia: NEGATIVE
Comment: NEGATIVE
Comment: NORMAL
Neisseria Gonorrhea: NEGATIVE

## 2023-09-05 ENCOUNTER — Other Ambulatory Visit: Payer: Self-pay

## 2023-09-05 ENCOUNTER — Ambulatory Visit: Payer: Self-pay | Admitting: *Deleted

## 2023-09-05 VITALS — BP 120/84 | HR 84 | Wt 275.5 lb

## 2023-09-05 DIAGNOSIS — Z1332 Encounter for screening for maternal depression: Secondary | ICD-10-CM

## 2023-09-05 DIAGNOSIS — O3680X0 Pregnancy with inconclusive fetal viability, not applicable or unspecified: Secondary | ICD-10-CM

## 2023-09-05 DIAGNOSIS — Z3A13 13 weeks gestation of pregnancy: Secondary | ICD-10-CM

## 2023-09-05 DIAGNOSIS — O0990 Supervision of high risk pregnancy, unspecified, unspecified trimester: Secondary | ICD-10-CM

## 2023-09-05 DIAGNOSIS — O0991 Supervision of high risk pregnancy, unspecified, first trimester: Secondary | ICD-10-CM

## 2023-09-05 MED ORDER — VITAFOL GUMMIES 3.33-0.333-34.8 MG PO CHEW: 3.0000 | CHEWABLE_TABLET | Freq: Every day | ORAL | 11 refills | Status: AC

## 2023-09-05 NOTE — Patient Instructions (Signed)

## 2023-09-05 NOTE — Progress Notes (Signed)
 New OB Intake  I connected with Alexandra Raymond  on 09/05/23 at  9:15 AM EDT by In Person Visit and verified that I am speaking with the correct person using two identifiers. Nurse is located at [facility] and pt is located at [place].  I discussed the limitations, risks, security and privacy concerns of performing an evaluation and management service by telephone and the availability of in person appointments. I also discussed with the patient that there may be a patient responsible charge related to this service. The patient expressed understanding and agreed to proceed.  I explained I am completing New OB Intake today. We discussed EDD of 03/07/24 based on LMP of 06/01/23. Pt is G4P2103. I reviewed her allergies, medications and Medical/Surgical/OB history.    Patient Active Problem List   Diagnosis Date Noted   Supervision of high risk pregnancy, antepartum 09/05/2023   Vaginal delivery 12/10/2019   Normal labor 12/09/2019   BMI 45.0-49.9, adult (HCC) 11/16/2019   Obesity in pregnancy, antepartum    History of preterm delivery    PCOS (polycystic ovarian syndrome) 03/03/2013     Concerns addressed today  Delivery Plans Plans to deliver at [facility]. Discussed the nature of our practice with multiple providers including residents and students as well as female and female providers. Due to the size of the practice, the delivering provider may not be the same as those providing prenatal care.   Patient is not interested in water birth.  MyChart/Babyscripts MyChart access verified. I explained pt will have some visits in office and some virtually. Babyscripts instructions given and order placed. Patient verifies receipt of registration text/e-mail. Account successfully created and app downloaded. If patient is a candidate for Optimized scheduling, add to sticky note.   Blood Pressure Cuff/Weight Scale Pt has BP cuff at home. Explained after first prenatal appt pt will check weekly and  document in Babyscripts. Patient does not have weight scale; patient may purchase if they desire to track weight weekly in Babyscripts.  Anatomy US  Explained first scheduled US  will be around 19 weeks. Anatomy US  scheduled for TBD at TBD.  Is patient a candidate for Babyscripts Optimization? No, due to Risk Factors   First visit review I reviewed new OB appt with patient. Explained pt will be seen by Nidia Daring, NP at first visit. Discussed [place] genetic screening with patient. Requests Panor[facility] and [facility].. Routine prenatal labs collected at today's visit.   Last Pap Diagnosis  Date Value Ref Range Status  10/15/2018   Final   - Negative for intraepithelial lesion or malignancy (NILM)    Rocky CHRISTELLA Ober, RN 09/05/2023  1:11 PM

## 2023-09-06 LAB — CBC/D/PLT+RPR+RH+ABO+RUBIGG...
Antibody Screen: NEGATIVE
Basophils Absolute: 0 x10E3/uL (ref 0.0–0.2)
Basos: 1 %
EOS (ABSOLUTE): 0.1 x10E3/uL (ref 0.0–0.4)
Eos: 1 %
HCV Ab: NONREACTIVE
HIV Screen 4th Generation wRfx: NONREACTIVE
Hematocrit: 39.6 % (ref 34.0–46.6)
Hemoglobin: 13 g/dL (ref 11.1–15.9)
Hepatitis B Surface Ag: NEGATIVE
Immature Grans (Abs): 0.1 x10E3/uL (ref 0.0–0.1)
Immature Granulocytes: 1 %
Lymphocytes Absolute: 2.5 x10E3/uL (ref 0.7–3.1)
Lymphs: 30 %
MCH: 31 pg (ref 26.6–33.0)
MCHC: 32.8 g/dL (ref 31.5–35.7)
MCV: 94 fL (ref 79–97)
Monocytes Absolute: 0.6 x10E3/uL (ref 0.1–0.9)
Monocytes: 7 %
Neutrophils Absolute: 5 x10E3/uL (ref 1.4–7.0)
Neutrophils: 60 %
Platelets: 176 x10E3/uL (ref 150–450)
RBC: 4.2 x10E6/uL (ref 3.77–5.28)
RDW: 13.6 % (ref 11.7–15.4)
RPR Ser Ql: NONREACTIVE
Rh Factor: POSITIVE
Rubella Antibodies, IGG: 1.03 {index} (ref >0.99)
WBC: 8.1 x10E3/uL (ref 3.4–10.8)

## 2023-09-06 LAB — HCV INTERPRETATION

## 2023-09-06 LAB — HEMOGLOBIN A1C
Est. average glucose Bld gHb Est-mCnc: 111 mg/dL
Hgb A1c MFr Bld: 5.5 % (ref 4.8–5.6)

## 2023-09-07 LAB — URINE CULTURE, OB REFLEX

## 2023-09-07 LAB — CULTURE, OB URINE

## 2023-09-11 LAB — PANORAMA PRENATAL TEST FULL PANEL:PANORAMA TEST PLUS 5 ADDITIONAL MICRODELETIONS
FETAL FRACTION SECOND FETUS: 3.9
FETAL FRACTION: 4.7

## 2023-09-11 LAB — HORIZON CUSTOM: REPORT SUMMARY: NEGATIVE

## 2023-09-24 ENCOUNTER — Ambulatory Visit: Admitting: Obstetrics and Gynecology

## 2023-09-24 ENCOUNTER — Encounter: Payer: Self-pay | Admitting: Obstetrics and Gynecology

## 2023-09-24 VITALS — BP 127/74 | HR 96 | Wt 277.0 lb

## 2023-09-24 DIAGNOSIS — Z3A16 16 weeks gestation of pregnancy: Secondary | ICD-10-CM | POA: Diagnosis not present

## 2023-09-24 DIAGNOSIS — O0990 Supervision of high risk pregnancy, unspecified, unspecified trimester: Secondary | ICD-10-CM | POA: Diagnosis not present

## 2023-09-24 NOTE — Progress Notes (Unsigned)
 Pt presents for NOB visit. Pt c/o numbness, pain and swelling in the right hand and arm

## 2023-09-26 ENCOUNTER — Ambulatory Visit: Payer: Self-pay | Admitting: Obstetrics and Gynecology

## 2023-09-26 LAB — AFP, SERUM, OPEN SPINA BIFIDA
AFP MoM: 1.91
AFP Value: 44.3 ng/mL
Gest. Age on Collection Date: 16 wk
Maternal Age At EDD: 32.7 a
OSBR Risk 1 IN: 2018
Test Results:: NEGATIVE
Weight: 277 [lb_av]

## 2023-10-03 ENCOUNTER — Encounter (HOSPITAL_BASED_OUTPATIENT_CLINIC_OR_DEPARTMENT_OTHER): Payer: Self-pay | Admitting: Certified Nurse Midwife

## 2023-10-03 ENCOUNTER — Other Ambulatory Visit (HOSPITAL_COMMUNITY)
Admission: RE | Admit: 2023-10-03 | Discharge: 2023-10-03 | Disposition: A | Discharge status: Home / Self Care | Source: Ambulatory Visit | Attending: Certified Nurse Midwife | Admitting: Certified Nurse Midwife

## 2023-10-03 ENCOUNTER — Ambulatory Visit (HOSPITAL_BASED_OUTPATIENT_CLINIC_OR_DEPARTMENT_OTHER): Admitting: Certified Nurse Midwife

## 2023-10-03 VITALS — BP 113/66 | HR 84 | Wt 278.4 lb

## 2023-10-03 DIAGNOSIS — Z124 Encounter for screening for malignant neoplasm of cervix: Secondary | ICD-10-CM | POA: Insufficient documentation

## 2023-10-03 DIAGNOSIS — O99212 Obesity complicating pregnancy, second trimester: Secondary | ICD-10-CM

## 2023-10-03 DIAGNOSIS — O0990 Supervision of high risk pregnancy, unspecified, unspecified trimester: Secondary | ICD-10-CM | POA: Diagnosis not present

## 2023-10-03 DIAGNOSIS — Z8751 Personal history of pre-term labor: Secondary | ICD-10-CM | POA: Diagnosis not present

## 2023-10-03 DIAGNOSIS — O99210 Obesity complicating pregnancy, unspecified trimester: Secondary | ICD-10-CM | POA: Diagnosis not present

## 2023-10-03 DIAGNOSIS — O09292 Supervision of pregnancy with other poor reproductive or obstetric history, second trimester: Secondary | ICD-10-CM | POA: Diagnosis not present

## 2023-10-03 DIAGNOSIS — Z3A17 17 weeks gestation of pregnancy: Secondary | ICD-10-CM

## 2023-10-03 DIAGNOSIS — O30002 Twin pregnancy, unspecified number of placenta and unspecified number of amniotic sacs, second trimester: Secondary | ICD-10-CM

## 2023-10-03 DIAGNOSIS — O30049 Twin pregnancy, dichorionic/diamniotic, unspecified trimester: Secondary | ICD-10-CM

## 2023-10-03 MED ORDER — PROGESTERONE 200 MG PO CAPS: 200.0000 mg | ORAL_CAPSULE | Freq: Every day | ORAL | 8 refills | Status: DC

## 2023-10-03 MED ORDER — BLOOD PRESSURE KIT DEVI: 1.0000 | 0 refills | Status: DC

## 2023-10-03 MED ORDER — ASPIRIN 81 MG PO TBEC: 81.0000 mg | DELAYED_RELEASE_TABLET | Freq: Every day | ORAL | 12 refills | Status: DC

## 2023-10-03 NOTE — Progress Notes (Signed)
 Baby A 147bpm Baby B 149bpm    PRENATAL VISIT NOTE  Subjective:  Alexandra Raymond is a 33 y.o. [id number] at [redacted]w[redacted]d being seen today for ongoing prenatal care.  She is currently monitored for the following issues for this high-risk pregnancy and has PCOS (polycystic ovarian syndrome); History of preterm delivery; Obesity in pregnancy, antepartum; BMI 45.0-49.9, adult (HCC); and Supervision of high risk pregnancy, antepartum on their problem list.  Patient reports no complaints.  Contractions: Not present. Vag. Bleeding: None.  Movement: Present. Denies leaking of fluid.   The following portions of the patient's history were reviewed and updated as appropriate: allergies, current medications, past family history, past medical history, past social history, past surgical history and problem list.   Objective:    Vitals:   10/03/23 0918  BP: 113/66  Pulse: 84  Weight: 278 lb 6.4 oz (126.3 kg)    Fetal Status:      Movement: Present    General: Alert, oriented and cooperative. Patient is in no acute distress.  Skin: Skin is warm and dry. No rash noted.   Cardiovascular: Normal heart rate noted  Respiratory: Normal respiratory effort, no problems with respiration noted  Abdomen: Soft, gravid, appropriate for gestational age.  Pain/Pressure: Absent     Pelvic: Cervical exam deferred        Extremities: Normal range of motion.  Edema: Trace (pt reports that middle finger will swell a little bit)  Mental Status: Normal mood and affect. Normal behavior. Normal judgment and thought content.   Assessment and Plan:  Pregnancy: [id number] at [redacted]w[redacted]d  1. History of preterm delivery (Primary) - Progesterone  200mg  vaginally qhs - US  MFM OB DETAIL ADDL GEST +14 WK; Future - US  MFM OB DETAIL +14 WK; Future - CBC - Comp Met (CMET) - Progesterone   2. Maternal morbid obesity, antepartum (HCC) - HgA1C Normal - US  MFM OB DETAIL ADDL GEST +14 WK; Future - US  MFM OB DETAIL +14 WK; Future - CBC -  Comp Met (CMET) - Progesterone   3. Supervision of high risk pregnancy, antepartum - Start ASA 81mg  po once daily - CBC - Comp Met (CMET) - Progesterone   4. Cervical cancer screening - Cytology - PAP( [facility])  5. Twin gestation in second trimester, unspecified multiple gestation type - US  MFM OB DETAIL ADDL GEST +14 WK; Future - US  MFM OB DETAIL +14 WK; Future - Babyscripts Schedule Optimization - Enroll Patient in PreNatal Babyscripts - CBC - Comp Met (CMET) - Progesterone  - VITAMIN D  25 Hydroxy (Vit-D Deficiency, Fractures)  Preterm labor symptoms and general obstetric precautions including but not limited to vaginal bleeding, contractions, leaking of fluid and fetal movement were reviewed in detail with the patient. Please refer to After Visit Summary for other counseling recommendations.   No follow-ups on file.  Future Appointments  Date Time Provider Department Center  10/28/2023  2:15 PM Cleotilde Ronal RAMAN, MD [facility] [phone] [facility]    Arland MARLA Roller, CNM

## 2023-10-04 LAB — COMPREHENSIVE METABOLIC PANEL WITH GFR
ALT: 23 IU/L (ref 0–32)
AST: 18 IU/L (ref 0–40)
Albumin: 3.8 g/dL — ABNORMAL LOW (ref 3.9–4.9)
Alkaline Phosphatase: 90 IU/L (ref 41–116)
BUN/Creatinine Ratio: 13 (ref 9–23)
BUN: 7 mg/dL (ref 6–20)
Bilirubin Total: 0.2 mg/dL (ref 0.0–1.2)
CO2: 20 mmol/L (ref 20–29)
Calcium: 9 mg/dL (ref 8.7–10.2)
Chloride: 104 mmol/L (ref 96–106)
Creatinine, Ser: 0.54 mg/dL — ABNORMAL LOW (ref 0.57–1.00)
Globulin, Total: 2.7 g/dL (ref 1.5–4.5)
Glucose: 80 mg/dL (ref 70–99)
Potassium: 4 mmol/L (ref 3.5–5.2)
Sodium: 139 mmol/L (ref 134–144)
Total Protein: 6.5 g/dL (ref 6.0–8.5)
eGFR: 125 mL/min/1.73 (ref >59)

## 2023-10-04 LAB — CBC
Hematocrit: 37.9 % (ref 34.0–46.6)
Hemoglobin: 12.2 g/dL (ref 11.1–15.9)
MCH: 30.3 pg (ref 26.6–33.0)
MCHC: 32.2 g/dL (ref 31.5–35.7)
MCV: 94 fL (ref 79–97)
Platelets: 184 x10E3/uL (ref 150–450)
RBC: 4.03 x10E6/uL (ref 3.77–5.28)
RDW: 14 % (ref 11.7–15.4)
WBC: 9.6 x10E3/uL (ref 3.4–10.8)

## 2023-10-04 LAB — PROGESTERONE: Progesterone: 35.8 ng/mL

## 2023-10-04 LAB — VITAMIN D 25 HYDROXY (VIT D DEFICIENCY, FRACTURES): Vit D, 25-Hydroxy: 24.6 ng/mL — ABNORMAL LOW (ref 30.0–100.0)

## 2023-10-06 ENCOUNTER — Ambulatory Visit (HOSPITAL_BASED_OUTPATIENT_CLINIC_OR_DEPARTMENT_OTHER): Payer: Self-pay | Admitting: Certified Nurse Midwife

## 2023-10-06 DIAGNOSIS — O30049 Twin pregnancy, dichorionic/diamniotic, unspecified trimester: Secondary | ICD-10-CM | POA: Insufficient documentation

## 2023-10-06 DIAGNOSIS — R7989 Other specified abnormal findings of blood chemistry: Secondary | ICD-10-CM

## 2023-10-06 MED ORDER — VITAMIN D (ERGOCALCIFEROL) 1.25 MG (50000 UNIT) PO CAPS: 50000.0000 [IU] | ORAL_CAPSULE | ORAL | 12 refills | Status: AC

## 2023-10-07 LAB — CYTOLOGY - PAP
Chlamydia: NEGATIVE
Comment: NEGATIVE
Comment: NEGATIVE
Comment: NEGATIVE
Comment: NORMAL
Diagnosis: NEGATIVE
High risk HPV: NEGATIVE
Neisseria Gonorrhea: NEGATIVE
Trichomonas: NEGATIVE

## 2023-10-15 ENCOUNTER — Other Ambulatory Visit (HOSPITAL_BASED_OUTPATIENT_CLINIC_OR_DEPARTMENT_OTHER): Payer: Self-pay | Admitting: Certified Nurse Midwife

## 2023-10-15 DIAGNOSIS — O30049 Twin pregnancy, dichorionic/diamniotic, unspecified trimester: Secondary | ICD-10-CM

## 2023-10-22 ENCOUNTER — Encounter: Admitting: Obstetrics and Gynecology

## 2023-10-23 ENCOUNTER — Other Ambulatory Visit

## 2023-10-23 ENCOUNTER — Ambulatory Visit

## 2023-10-28 ENCOUNTER — Encounter (HOSPITAL_BASED_OUTPATIENT_CLINIC_OR_DEPARTMENT_OTHER): Payer: Self-pay | Admitting: Obstetrics & Gynecology

## 2023-10-28 ENCOUNTER — Ambulatory Visit (INDEPENDENT_AMBULATORY_CARE_PROVIDER_SITE_OTHER): Admitting: Obstetrics & Gynecology

## 2023-10-28 ENCOUNTER — Other Ambulatory Visit (HOSPITAL_COMMUNITY)
Admission: RE | Admit: 2023-10-28 | Discharge: 2023-10-28 | Disposition: A | Discharge status: Home / Self Care | Source: Ambulatory Visit | Attending: Obstetrics & Gynecology | Admitting: Obstetrics & Gynecology

## 2023-10-28 VITALS — BP 120/67 | HR 92 | Wt 282.4 lb

## 2023-10-28 DIAGNOSIS — Z6841 Body Mass Index (BMI) 40.0 and over, adult: Secondary | ICD-10-CM

## 2023-10-28 DIAGNOSIS — N898 Other specified noninflammatory disorders of vagina: Secondary | ICD-10-CM

## 2023-10-28 DIAGNOSIS — Z3A21 21 weeks gestation of pregnancy: Secondary | ICD-10-CM

## 2023-10-28 DIAGNOSIS — O99213 Obesity complicating pregnancy, third trimester: Secondary | ICD-10-CM | POA: Diagnosis not present

## 2023-10-28 DIAGNOSIS — O30042 Twin pregnancy, dichorionic/diamniotic, second trimester: Secondary | ICD-10-CM | POA: Diagnosis not present

## 2023-10-28 DIAGNOSIS — O0990 Supervision of high risk pregnancy, unspecified, unspecified trimester: Secondary | ICD-10-CM

## 2023-10-28 DIAGNOSIS — O4692 Antepartum hemorrhage, unspecified, second trimester: Secondary | ICD-10-CM | POA: Diagnosis not present

## 2023-10-28 DIAGNOSIS — Z8751 Personal history of pre-term labor: Secondary | ICD-10-CM

## 2023-10-28 DIAGNOSIS — O30049 Twin pregnancy, dichorionic/diamniotic, unspecified trimester: Secondary | ICD-10-CM

## 2023-10-28 DIAGNOSIS — N939 Abnormal uterine and vaginal bleeding, unspecified: Secondary | ICD-10-CM

## 2023-10-28 NOTE — Progress Notes (Signed)
   PRENATAL VISIT NOTE  Subjective:  Alexandra Raymond is a 33 y.o. [phone] at [redacted]w[redacted]d being seen today for ongoing prenatal care.  She is currently monitored for the following issues for this high-risk pregnancy and has PCOS (polycystic ovarian syndrome); History of preterm delivery; Obesity in pregnancy, antepartum; BMI 45.0-49.9, adult (HCC); Supervision of high risk pregnancy, antepartum; Twin gestation in second trimester; Maternal morbid obesity, antepartum (HCC); and Dichorionic diamniotic twin pregnancy, antepartum on their problem list.  Patient reports bright red spotting when she wiped that she noticed last Past Saturday, 10/11.  The bleeding/spotting stopped that day and there was a little cramping on Sunday.   Has had not had bleeding in three full days .  Contractions: Not present. Vag. Bleeding: Scant (pt reports some spotting on toilet paper when she wipes).  Movement: Present. Denies leaking of fluid.   The following portions of the patient's history were reviewed and updated as appropriate: allergies, current medications, past family history, past medical history, past social history, past surgical history and problem list.   Objective:    Vitals:   10/28/23 1413  BP: 120/67  Pulse: 92  Weight: 282 lb 6.4 oz (128.1 kg)    Fetal Status:  Fetal Heart Rate (bpm): 144/156 Fundal Height: 34 cm Movement: Present    General: Alert, oriented and cooperative. Patient is in no acute distress.  Skin: Skin is warm and dry. No rash noted.   Cardiovascular: Normal heart rate noted  Respiratory: Normal respiratory effort, no problems with respiration noted  Abdomen: Soft, gravid, appropriate for gestational age.  Pain/Pressure: Present (lower pelvic pain)     Pelvic: Cervical exam performed in the presence of a chaperone Dilation: Closed      Extremities: Normal range of motion.  Edema: None  Mental Status: Normal mood and affect. Normal behavior. Normal judgment and thought content.    Assessment and Plan:  Pregnancy: [id number] at [redacted]w[redacted]d 1. Supervision of high risk pregnancy, antepartum (Primary) - on PNV and baby ASA  2. Vaginal bleeding - discussed causes.  Images form 8/22 reviewed.  Possible low lying placenta present.  Reasons to go to the MAU discussed.   - called MFM to see if appt could be moved upt.    3. Dichorionic diamniotic twin pregnancy, antepartum  4. Vaginal discharge - Cervicovaginal ancillary only( [facility])  5. History of preterm delivery - at 29 weeks with first pregnancy.  Second and third were term while using 17-OH-P and now using vaginal progesterone  suppositories  6. BMI 45.0-49.9, adult (HCC)  7. Maternal morbid obesity, antepartum (HCC)  Preterm labor symptoms and general obstetric precautions including but not limited to vaginal bleeding, contractions, leaking of fluid and fetal movement were reviewed in detail with the patient. Please refer to After Visit Summary for other counseling recommendations.   Return in about 4 weeks (around 11/25/2023).  Future Appointments  Date Time Provider Department Center  11/21/2023  9:15 AM [facility] PROVIDER 1 [facility]  11/21/2023  9:30 AM [facility] US4 [facility]    Ronal GORMAN Pinal, MD

## 2023-10-29 LAB — CERVICOVAGINAL ANCILLARY ONLY
Bacterial Vaginitis (gardnerella): POSITIVE — AB
Candida Glabrata: NEGATIVE
Candida Vaginitis: NEGATIVE
Comment: NEGATIVE
Comment: NEGATIVE
Comment: NEGATIVE

## 2023-10-30 ENCOUNTER — Ambulatory Visit (HOSPITAL_BASED_OUTPATIENT_CLINIC_OR_DEPARTMENT_OTHER): Payer: Self-pay | Admitting: Obstetrics & Gynecology

## 2023-10-30 MED ORDER — METRONIDAZOLE 500 MG PO TABS
500.0000 mg | ORAL_TABLET | Freq: Two times a day (BID) | ORAL | 0 refills | Status: AC
Start: 2023-10-30 — End: ?

## 2023-11-21 ENCOUNTER — Ambulatory Visit: Attending: Certified Nurse Midwife

## 2023-11-21 ENCOUNTER — Ambulatory Visit (HOSPITAL_BASED_OUTPATIENT_CLINIC_OR_DEPARTMENT_OTHER): Admitting: Obstetrics

## 2023-11-21 ENCOUNTER — Other Ambulatory Visit: Payer: Self-pay | Admitting: *Deleted

## 2023-11-21 VITALS — BP 119/60 | HR 87

## 2023-11-21 DIAGNOSIS — O99210 Obesity complicating pregnancy, unspecified trimester: Secondary | ICD-10-CM | POA: Diagnosis not present

## 2023-11-21 DIAGNOSIS — O0990 Supervision of high risk pregnancy, unspecified, unspecified trimester: Secondary | ICD-10-CM | POA: Insufficient documentation

## 2023-11-21 DIAGNOSIS — Z8751 Personal history of pre-term labor: Secondary | ICD-10-CM | POA: Insufficient documentation

## 2023-11-21 DIAGNOSIS — O99212 Obesity complicating pregnancy, second trimester: Secondary | ICD-10-CM | POA: Insufficient documentation

## 2023-11-21 DIAGNOSIS — O30042 Twin pregnancy, dichorionic/diamniotic, second trimester: Secondary | ICD-10-CM | POA: Insufficient documentation

## 2023-11-21 DIAGNOSIS — O30049 Twin pregnancy, dichorionic/diamniotic, unspecified trimester: Secondary | ICD-10-CM

## 2023-11-21 DIAGNOSIS — O09212 Supervision of pregnancy with history of pre-term labor, second trimester: Secondary | ICD-10-CM | POA: Diagnosis not present

## 2023-11-21 DIAGNOSIS — O30002 Twin pregnancy, unspecified number of placenta and unspecified number of amniotic sacs, second trimester: Secondary | ICD-10-CM | POA: Insufficient documentation

## 2023-11-21 DIAGNOSIS — Z3A24 24 weeks gestation of pregnancy: Secondary | ICD-10-CM | POA: Insufficient documentation

## 2023-11-21 NOTE — Progress Notes (Signed)
 MFM Consult Note  Alexandra Raymond is currently at 24 weeks and 5 days.  She was seen due to a spontaneously conceived twin pregnancy and maternal obesity with a BMI of 47.7.  She has a history of a prior 29-week delivery followed by two full-term deliveries.  She was treated with weekly 17-P injections during the 2 full-term deliveries.  She is currently treated with daily vaginal progesterone  to decrease her risk of a preterm birth.  The patient had a cell free DNA test earlier in her pregnancy which indicated a low risk for trisomy 51, 18, and 13.  These are predicted to be dizygotic/fraternal twins.  The patient did not want the fetal genders revealed today.  A thick dividing membrane and two separate placentas were was noted separating the two fetuses, indicating that these are dichorionic, diamniotic twins.  Sonographic findings Di-di intrauterine pregnancy at 24w 5d. Fetal cardiac activity: A: Observed, B: Observed. Presentation: A: Cephalic, B: Cephalic. The views of the fetal anatomy were limited today due to maternal body habitus and the fetal position.   Twin A: EFW 1 pound 9 ounces (29th percentile).  Normal amniotic fluid.  Possible persistent right umbilical vein noted in the fetal abdomen. Twin B: EFW 1 pound 11 ounces (55th percentile).  Normal amniotic fluid.  No obvious fetal anomalies noted Placenta: A: Posterior Rt, B: Anterior. Cervix: Normal appearance by transabdominal scan with a cervical length of 4.5. Adnexa: No abnormality visualized.  The limitations of ultrasound in the detection of all anomalies was discussed today.  Dichorionic, diamniotic twins and maternal obesity The management of dichorionic twins was discussed.   She was advised that management of twin pregnancies will involve frequent ultrasound exams to assess the fetal growth and amniotic fluid level.  Due to the twin gestation and maternal obesity, we will start weekly fetal testing at 34 weeks.    Delivery for uncomplicated dichorionic twins should occur at around 38 weeks. The increased risk of preeclampsia, gestational diabetes, and preterm birth/labor associated with twin pregnancies was discussed. She was advised to continue taking a daily baby aspirin  (81 mg per day) to decrease her risk of developing preeclampsia.   Persistent right umbilical vein in twin A As there were no other abnormalities noted in twin A, the patient was reassured that the persistent right umbilical vein is most likely a normal variant.   We will continue to follow her closely to assess this finding.  A follow-up exam was scheduled in 4 weeks to assess the fetal growth and to complete the views of the fetal anatomy.    The patient stated that all of her questions were answered today.  A total of 30 minutes was spent counseling and coordinating the care for this patient.  Greater than 50% of the time was spent in direct face-to-face contact.

## 2023-12-02 ENCOUNTER — Other Ambulatory Visit (HOSPITAL_BASED_OUTPATIENT_CLINIC_OR_DEPARTMENT_OTHER): Payer: Self-pay

## 2023-12-02 ENCOUNTER — Ambulatory Visit (HOSPITAL_BASED_OUTPATIENT_CLINIC_OR_DEPARTMENT_OTHER): Admitting: Certified Nurse Midwife

## 2023-12-02 VITALS — BP 118/70 | HR 97 | Wt 286.6 lb

## 2023-12-02 DIAGNOSIS — Z3492 Encounter for supervision of normal pregnancy, unspecified, second trimester: Secondary | ICD-10-CM | POA: Diagnosis not present

## 2023-12-02 DIAGNOSIS — Z8751 Personal history of pre-term labor: Secondary | ICD-10-CM

## 2023-12-02 DIAGNOSIS — O30042 Twin pregnancy, dichorionic/diamniotic, second trimester: Secondary | ICD-10-CM | POA: Diagnosis not present

## 2023-12-02 DIAGNOSIS — O09212 Supervision of pregnancy with history of pre-term labor, second trimester: Secondary | ICD-10-CM | POA: Diagnosis not present

## 2023-12-02 DIAGNOSIS — O0990 Supervision of high risk pregnancy, unspecified, unspecified trimester: Secondary | ICD-10-CM

## 2023-12-02 DIAGNOSIS — O99212 Obesity complicating pregnancy, second trimester: Secondary | ICD-10-CM | POA: Diagnosis not present

## 2023-12-02 DIAGNOSIS — Z3A26 26 weeks gestation of pregnancy: Secondary | ICD-10-CM

## 2023-12-02 DIAGNOSIS — O30049 Twin pregnancy, dichorionic/diamniotic, unspecified trimester: Secondary | ICD-10-CM

## 2023-12-03 ENCOUNTER — Ambulatory Visit (HOSPITAL_BASED_OUTPATIENT_CLINIC_OR_DEPARTMENT_OTHER): Payer: Self-pay | Admitting: Obstetrics & Gynecology

## 2023-12-03 DIAGNOSIS — O0990 Supervision of high risk pregnancy, unspecified, unspecified trimester: Secondary | ICD-10-CM

## 2023-12-03 LAB — GLUCOSE TOLERANCE, 2 HOURS W/ 1HR
Glucose, 1 hour: 119 mg/dL (ref 70–179)
Glucose, 2 hour: 100 mg/dL (ref 70–152)
Glucose, Fasting: 79 mg/dL (ref 70–91)

## 2023-12-03 LAB — CBC
Hematocrit: 36.8 % (ref 34.0–46.6)
Hemoglobin: 11.8 g/dL (ref 11.1–15.9)
MCH: 30.2 pg (ref 26.6–33.0)
MCHC: 32.1 g/dL (ref 31.5–35.7)
MCV: 94 fL (ref 79–97)
Platelets: 213 x10E3/uL (ref 150–450)
RBC: 3.91 x10E6/uL (ref 3.77–5.28)
RDW: 14.1 % (ref 11.7–15.4)
WBC: 10.2 x10E3/uL (ref 3.4–10.8)

## 2023-12-03 LAB — HIV ANTIBODY (ROUTINE TESTING W REFLEX): HIV Screen 4th Generation wRfx: NONREACTIVE

## 2023-12-03 LAB — RPR: RPR Ser Ql: NONREACTIVE

## 2023-12-04 NOTE — Progress Notes (Signed)
 PRENATAL VISIT NOTE  Subjective:  Alexandra Raymond is a 33 y.o. [phone] at [redacted]w[redacted]d being seen today for ongoing prenatal care.  She is currently monitored for the following issues for this high-risk pregnancy and has PCOS (polycystic ovarian syndrome); History of preterm delivery; BMI 45.0-49.9, adult (HCC); Supervision of high risk pregnancy, antepartum; Twin gestation in second trimester; Maternal morbid obesity, antepartum (HCC); and Dichorionic diamniotic twin pregnancy, antepartum on their problem list.  Patient reports no bleeding, no contractions, no cramping, and no leaking.  Contractions: Not present. Vag. Bleeding: None.  Movement: Present. Denies leaking of fluid.   The following portions of the patient's history were reviewed and updated as appropriate: allergies, current medications, past family history, past medical history, past social history, past surgical history and problem list.   Objective:   Vitals:   12/02/23 0952  BP: 118/70  Pulse: 97  Weight: 286 lb 9.6 oz (130 kg)    Fetal Status:  Fetal Heart Rate (bpm): 150 (Baby B 160)   Movement: Present    General: Alert, oriented and cooperative. Patient is in no acute distress.  Skin: Skin is warm and dry. No rash noted.   Cardiovascular: Normal heart rate noted  Respiratory: Normal respiratory effort, no problems with respiration noted  Abdomen: Soft, gravid, appropriate for gestational age.  Pain/Pressure: Absent     Pelvic: Cervical exam deferred        Extremities: Normal range of motion.  Edema: Trace (feet and 2 middle fingers)  Mental Status: Normal mood and affect. Normal behavior. Normal judgment and thought content.      09/05/2023   10:01 AM 01/12/2020    2:32 PM 12/24/2019    9:44 AM  Depression screen PHQ 2/9  Decreased Interest 0 0 0  Down, Depressed, Hopeless 0 0 0  PHQ - 2 Score 0 0 0  Altered sleeping 0 0   Tired, decreased energy 0 0   Change in appetite 0 0   Feeling bad or failure about  yourself  0 0   Trouble concentrating 0 0   Moving slowly or fidgety/restless 0 0   Suicidal thoughts 0 0   PHQ-9 Score 0  0       Data saved with a previous flowsheet row definition        09/05/2023   10:01 AM 12/24/2019    9:44 AM  GAD 7 : Generalized Anxiety Score  Nervous, Anxious, on Edge 1 0  Control/stop worrying 0 0  Worry too much - different things 0 0  Trouble relaxing 0 0  Restless 0 0  Easily annoyed or irritable 0 0  Afraid - awful might happen 0 0  Total GAD 7 Score 1 0    Assessment and Plan:  Pregnancy: [id number] at [redacted]w[redacted]d  1. Supervision of high risk pregnancy, antepartum (Primary) - US  (11/21/23) 24w5: Fetus A: Maternal Right side, vtx, posterior right placenta, AFI wnl, EFW 1lb 9oz (29%), AC 45%. Persistent right umbilical vein Twin A (no other abnormalities noted). Fetus B: Maternal left side, anterior placenta, AFI wnl, EFW 1lb 11oz (55%), AC 64%. CL 4.5cm.  Follow-up US  scheduled in 4 weeks.  - Panorama Low-Risk - Start weekly testing at 34 weeks - Taking PNV and ASA 81mg  po once daily - Pt passed 2hr GTT (No evidence of Gestational Diabetes)  2. [redacted] weeks gestation of pregnancy  3. Dichorionic diamniotic twin pregnancy, antepartum  4. History of preterm delivery - Hx of a Preterm delivery at  29wk followed by 2 Term Deliveries - Pt using vaginal progesterone   5. Maternal morbid obesity, antepartum (HCC) - Healthy diet encouraged - Routine walking encouraged   Preterm labor symptoms and general obstetric precautions including but not limited to vaginal bleeding, contractions, leaking of fluid and fetal movement were reviewed in detail with the patient. Please refer to After Visit Summary for other counseling recommendations.    Future Appointments  Date Time Provider Department Center  12/22/2023 11:15 AM [facility] PROVIDER 1 [facility]  12/22/2023 11:30 AM [facility] US1 [facility]  12/30/2023  2:55 PM Delores Nidia CROME, FNP [facility] [id number]   01/13/2024  2:35 PM Delores Nidia CROME, FNP [facility] [id number]  01/19/2024 10:15 AM [facility] PROVIDER 1 [facility]  01/19/2024 10:30 AM [facility] US4 [facility]  01/26/2024 10:15 AM [facility] PROVIDER 1 [facility]  01/26/2024 10:30 AM [facility] US4 [facility]  01/27/2024  2:35 PM Delores Nidia CROME, FNP [facility] [id number]  02/02/2024 10:15 AM [facility] PROVIDER 1 [facility]  02/02/2024 10:30 AM [facility] US4 [facility]  02/09/2024 10:15 AM [facility] PROVIDER 1 [facility]  02/09/2024 10:30 AM [facility] US2 [facility]  02/10/2024  2:35 PM Delores Nidia CROME, FNP [facility] [id number]  02/24/2024  2:35 PM Mei Suits, Arland POUR, CNM [facility] [id number]  03/02/2024  2:35 PM Dink Creps, Arland POUR, CNM [facility] [id number]  03/09/2024  2:35 PM Vonzella Althaus, Arland POUR, CNM [facility] [phone] [facility]    Arland POUR Roller, CNM

## 2023-12-22 ENCOUNTER — Ambulatory Visit: Attending: Obstetrics and Gynecology | Admitting: Obstetrics and Gynecology

## 2023-12-22 ENCOUNTER — Ambulatory Visit

## 2023-12-22 VITALS — BP 121/60 | HR 98

## 2023-12-22 DIAGNOSIS — Z8751 Personal history of pre-term labor: Secondary | ICD-10-CM | POA: Diagnosis not present

## 2023-12-22 DIAGNOSIS — O30049 Twin pregnancy, dichorionic/diamniotic, unspecified trimester: Secondary | ICD-10-CM | POA: Diagnosis not present

## 2023-12-22 DIAGNOSIS — O99210 Obesity complicating pregnancy, unspecified trimester: Secondary | ICD-10-CM

## 2023-12-22 DIAGNOSIS — Z3A29 29 weeks gestation of pregnancy: Secondary | ICD-10-CM

## 2023-12-22 DIAGNOSIS — O0990 Supervision of high risk pregnancy, unspecified, unspecified trimester: Secondary | ICD-10-CM | POA: Diagnosis not present

## 2023-12-22 DIAGNOSIS — O09213 Supervision of pregnancy with history of pre-term labor, third trimester: Secondary | ICD-10-CM | POA: Diagnosis not present

## 2023-12-22 DIAGNOSIS — O99213 Obesity complicating pregnancy, third trimester: Secondary | ICD-10-CM | POA: Diagnosis not present

## 2023-12-22 DIAGNOSIS — Z6841 Body Mass Index (BMI) 40.0 and over, adult: Secondary | ICD-10-CM | POA: Diagnosis not present

## 2023-12-22 DIAGNOSIS — O30043 Twin pregnancy, dichorionic/diamniotic, third trimester: Secondary | ICD-10-CM | POA: Diagnosis not present

## 2023-12-22 NOTE — Progress Notes (Signed)
 Maternal-Fetal Medicine Consultation  Name: Alexandra Raymond  MRN: [id number]  GA: [id number] [redacted]w[redacted]d   Dichorionic-diamniotic twin pregnancy.  Patient is here for completion of fetal anatomy growth assessment.  Cell free fetal DNA screening, the risks of fetal aneuploidies are not increased.  At fetal anatomy scan, persistent right umbilical vein was seen in twin A.  She does not have gestational diabetes.  Blood pressure today at our office is 121/60 mmHg.  Pregravid BMI 47.  Obstetric history significant for a spontaneous preterm delivery in her first pregnancy at [redacted] weeks gestation.  Subsequently, she took 17 alpha-hydroxyprogesterone injections in the next 2 pregnancies and had term deliveries.  Ultrasound Twin A: Maternal right, cephalic presentation, posterior placenta, female fetus.  Normal growth and amniotic fluid.  The estimated fetal weight is at the 14th percentile and the abdominal circumference measurement at the 20th percentile. Persistent right umbilical vein is seen.  No other obvious fetal structural defects are seen. Twin B: Maternal left, cephalic presentation, anterior placenta, female fetus.  Normal growth and amniotic fluid.  Fetal anatomical survey appears normal and no obvious fetal structural defects are seen. Growth discordancy: 14% (normal).  Twin pregnancy with history of preterm delivery 17-hydroxyprogesterone injections have been withdrawn after FDA's disapproval.  Patient does not have signs and symptoms of preterm labor.  I counseled the patient that twin pregnancies increase the risk of spontaneous preterm delivery and it is reassuring that she does not have any symptoms.  Mode of delivery I discussed the mode of delivery that is based on the presentations.  If both have Vx/Vx or Vx/non-vertex presentations, vaginal delivery may be considered. In Vx/non-vx, vaginal delivery followed by internal podalic version of second twin will achieve vaginal delivery. In  non-vx first twin presentation, cesarean delivery will be performed.  Persistent right umbilical vein I reassured the patient that in the absence of other abnormalities, PRUV is not associated with fetal adverse outcomes.  The risk of stillbirth is low if the patient is delivered this gestation, which is normally recommended in twin pregnancies.  Recommendations -Fetal growth assessment in 4 weeks.  -Weekly antenatal testing from 32-weeks' gestation till delivery(patient has appointments).     Consultation including face-to-face (more than 50%) counseling 30 minutes.

## 2023-12-30 ENCOUNTER — Encounter (HOSPITAL_BASED_OUTPATIENT_CLINIC_OR_DEPARTMENT_OTHER): Payer: Self-pay | Admitting: Obstetrics and Gynecology

## 2023-12-31 ENCOUNTER — Ambulatory Visit (HOSPITAL_BASED_OUTPATIENT_CLINIC_OR_DEPARTMENT_OTHER): Payer: Self-pay | Admitting: Certified Nurse Midwife

## 2023-12-31 ENCOUNTER — Encounter (HOSPITAL_BASED_OUTPATIENT_CLINIC_OR_DEPARTMENT_OTHER): Payer: Self-pay | Admitting: Certified Nurse Midwife

## 2023-12-31 VITALS — BP 118/69 | HR 102 | Wt 292.0 lb

## 2023-12-31 DIAGNOSIS — Z3A30 30 weeks gestation of pregnancy: Secondary | ICD-10-CM

## 2023-12-31 DIAGNOSIS — O09213 Supervision of pregnancy with history of pre-term labor, third trimester: Secondary | ICD-10-CM

## 2023-12-31 DIAGNOSIS — O99213 Obesity complicating pregnancy, third trimester: Secondary | ICD-10-CM | POA: Diagnosis not present

## 2023-12-31 DIAGNOSIS — O30043 Twin pregnancy, dichorionic/diamniotic, third trimester: Secondary | ICD-10-CM

## 2023-12-31 DIAGNOSIS — O30049 Twin pregnancy, dichorionic/diamniotic, unspecified trimester: Secondary | ICD-10-CM

## 2023-12-31 DIAGNOSIS — O0990 Supervision of high risk pregnancy, unspecified, unspecified trimester: Secondary | ICD-10-CM

## 2023-12-31 DIAGNOSIS — O09893 Supervision of other high risk pregnancies, third trimester: Secondary | ICD-10-CM

## 2023-12-31 MED ORDER — FLUCONAZOLE 150 MG PO TABS: 150.0000 mg | ORAL_TABLET | ORAL | 0 refills | Status: DC | PRN

## 2023-12-31 MED ORDER — METRONIDAZOLE 500 MG PO TABS: 500.0000 mg | ORAL_TABLET | Freq: Two times a day (BID) | ORAL | 2 refills | Status: DC

## 2023-12-31 NOTE — Progress Notes (Addendum)
 PRENATAL VISIT NOTE  Subjective:  Alexandra Raymond is a 33 y.o. [phone] at [redacted]w[redacted]d being seen today for ongoing prenatal care.  She is currently monitored for the following issues for this  pregnancy and has PCOS (polycystic ovarian syndrome); History of preterm delivery; BMI 45.0-49.9, adult (HCC); Supervision of high risk pregnancy, antepartum; Maternal morbid obesity, antepartum (HCC); and Dichorionic diamniotic twin pregnancy, antepartum on their problem list.  Patient reports no bleeding, no contractions, no cramping, and no leaking.  Contractions: Not present. Vag. Bleeding: None.  Movement: Present. Denies leaking of fluid.   The following portions of the patient's history were reviewed and updated as appropriate: allergies, current medications, past family history, past medical history, past social history, past surgical history and problem list.   Objective:   Vitals:   12/31/23 1355  BP: 118/69  Pulse: (!) 102  Weight: 292 lb (132.5 kg)    Fetal Status:      Movement: Present    General: Alert, oriented and cooperative. Patient is in no acute distress.  Skin: Skin is warm and dry. No rash noted.   Cardiovascular: Normal heart rate noted  Respiratory: Normal respiratory effort, no problems with respiration noted  Abdomen: Soft, gravid, appropriate for gestational age.  Pain/Pressure: Present (Pressure)     Pelvic: Cervical exam deferred        Extremities: Normal range of motion.  Edema: Trace (Feet are swelling)  Mental Status: Normal mood and affect. Normal behavior. Normal judgment and thought content.      09/05/2023   10:01 AM 01/12/2020    2:32 PM 12/24/2019    9:44 AM  Depression screen PHQ 2/9  Decreased Interest 0 0 0  Down, Depressed, Hopeless 0 0 0  PHQ - 2 Score 0 0 0  Altered sleeping 0 0   Tired, decreased energy 0 0   Change in appetite 0 0   Feeling bad or failure about yourself  0 0   Trouble concentrating 0 0   Moving slowly or  fidgety/restless 0 0   Suicidal thoughts 0 0   PHQ-9 Score 0  0       Data saved with a previous flowsheet row definition        09/05/2023   10:01 AM 12/24/2019    9:44 AM  GAD 7 : Generalized Anxiety Score  Nervous, Anxious, on Edge 1 0  Control/stop worrying 0 0  Worry too much - different things 0 0  Trouble relaxing 0 0  Restless 0 0  Easily annoyed or irritable 0 0  Afraid - awful might happen 0 0  Total GAD 7 Score 1 0    Assessment and Plan:  Pregnancy: [id number] at [redacted]w[redacted]d  1. Supervision of high risk pregnancy, antepartum (Primary) - US  (11/21/23) 24w5: Fetus A: Maternal Right side, vtx, posterior right placenta, AFI wnl, EFW 1lb 9oz (29%), AC 45%. Persistent right umbilical vein Twin A (no other abnormalities noted). Fetus B: Maternal left side, anterior placenta, AFI wnl, EFW 1lb 11oz (55%), AC 64%. CL 4.5cm.  Follow-up US  scheduled in 4 weeks.  - Panorama Low-Risk - Start weekly testing at 34 weeks - Taking PNV and ASA 81mg  po once daily - Pt passed 2hr GTT (No evidence of Gestational Diabetes)   2. [redacted] weeks gestation of pregnancy - Discussed recommendation for RSV Vaccine in 2 weeks (32 weeks) in pharmacy.   3. Dichorionic diamniotic twin pregnancy, antepartum   4. History of preterm delivery - Hx of  a Preterm delivery at 29wk followed by 2 Term Deliveries - Pt using vaginal progesterone    5. Maternal morbid obesity, antepartum (HCC) - Healthy diet encouraged - Routine walking encouraged - Total wt gain in pregnancy 14lb @ 30w3  Pt reports Hx BV x 1 in this pregnancy and thinks the BV is recurring. She is not sexually active. Denies vaginal spotting or bleeding. Reports some increase in vaginal discharge and +odor has returned. Hx BV. May repeat Flagyl  500mg  po BID x 7 days.   Preterm labor symptoms and general obstetric precautions including but not limited to vaginal bleeding, contractions, leaking of fluid and fetal movement were reviewed in detail with the  patient. Please refer to After Visit Summary for other counseling recommendations.   No follow-ups on file.  Future Appointments  Date Time Provider Department Center  01/13/2024  2:35 PM Delores Nidia CROME, FNP [facility] [id number]  01/19/2024 10:15 AM [facility] PROVIDER 1 [facility]  01/19/2024 10:30 AM [facility] US4 [facility]  01/26/2024 10:15 AM [facility] PROVIDER 1 [facility]  01/26/2024 10:30 AM [facility] US4 [facility]  01/27/2024  2:35 PM Delores Nidia CROME, FNP [facility] [id number]  02/02/2024 10:15 AM [facility] PROVIDER 1 [facility]  02/02/2024 10:30 AM [facility] US4 [facility]  02/09/2024 10:15 AM [facility] PROVIDER 1 [facility]  02/09/2024 10:30 AM [facility] US2 [facility]  02/10/2024  2:35 PM Delores Nidia CROME, FNP [facility] [id number]  02/24/2024  2:35 PM Delores Nidia CROME, FNP [facility] [id number]  03/02/2024  2:35 PM Delores Nidia CROME, FNP [facility] [phone] [facility]  03/09/2024  2:35 PM Delores Nidia CROME, FNP [facility] [phone] [facility]    Arland MARLA Roller, CNM

## 2024-01-06 ENCOUNTER — Encounter (HOSPITAL_BASED_OUTPATIENT_CLINIC_OR_DEPARTMENT_OTHER): Payer: Self-pay | Admitting: Certified Nurse Midwife

## 2024-01-13 ENCOUNTER — Ambulatory Visit (HOSPITAL_BASED_OUTPATIENT_CLINIC_OR_DEPARTMENT_OTHER): Payer: Self-pay | Admitting: Obstetrics and Gynecology

## 2024-01-13 VITALS — BP 116/72 | HR 90 | Wt 295.0 lb

## 2024-01-13 DIAGNOSIS — O09213 Supervision of pregnancy with history of pre-term labor, third trimester: Secondary | ICD-10-CM | POA: Diagnosis not present

## 2024-01-13 DIAGNOSIS — Z8751 Personal history of pre-term labor: Secondary | ICD-10-CM

## 2024-01-13 DIAGNOSIS — O30043 Twin pregnancy, dichorionic/diamniotic, third trimester: Secondary | ICD-10-CM

## 2024-01-13 DIAGNOSIS — O09893 Supervision of other high risk pregnancies, third trimester: Secondary | ICD-10-CM | POA: Diagnosis not present

## 2024-01-13 DIAGNOSIS — Z23 Encounter for immunization: Secondary | ICD-10-CM

## 2024-01-13 DIAGNOSIS — O99213 Obesity complicating pregnancy, third trimester: Secondary | ICD-10-CM

## 2024-01-13 DIAGNOSIS — Z3A32 32 weeks gestation of pregnancy: Secondary | ICD-10-CM

## 2024-01-13 DIAGNOSIS — O30049 Twin pregnancy, dichorionic/diamniotic, unspecified trimester: Secondary | ICD-10-CM

## 2024-01-13 DIAGNOSIS — Z6841 Body Mass Index (BMI) 40.0 and over, adult: Secondary | ICD-10-CM

## 2024-01-13 DIAGNOSIS — B9689 Other specified bacterial agents as the cause of diseases classified elsewhere: Secondary | ICD-10-CM

## 2024-01-13 DIAGNOSIS — O0990 Supervision of high risk pregnancy, unspecified, unspecified trimester: Secondary | ICD-10-CM

## 2024-01-13 DIAGNOSIS — Z3009 Encounter for other general counseling and advice on contraception: Secondary | ICD-10-CM | POA: Insufficient documentation

## 2024-01-13 MED ORDER — CLINDAMYCIN HCL 300 MG PO CAPS: 300.0000 mg | ORAL_CAPSULE | Freq: Two times a day (BID) | ORAL | 0 refills | Status: AC

## 2024-01-13 NOTE — Progress Notes (Signed)
" ° °  PRENATAL VISIT NOTE  Subjective:  Alexandra Raymond is a 33 y.o. [phone] at [redacted]w[redacted]d being seen today for ongoing prenatal care.  She is currently monitored for the following issues for this high-risk pregnancy and has PCOS (polycystic ovarian syndrome); History of preterm delivery; BMI 45.0-49.9, adult (HCC); Supervision of high risk pregnancy, antepartum; Maternal morbid obesity, antepartum (HCC); and Dichorionic diamniotic twin pregnancy, antepartum on their problem list.  Patient reports reports allergy to metronidazole , after she started it she started itching and a rash, symptoms resolved with stopping metronidazole  .  Contractions: Not present. Vag. Bleeding: None.  Movement: Present. Denies leaking of fluid.   The following portions of the patient's history were reviewed and updated as appropriate: allergies, current medications, past family history, past medical history, past social history, past surgical history and problem list.   Objective:   Vitals:   01/13/24 1450  BP: 116/72  Pulse: 90  Weight: 295 lb (133.8 kg)    Fetal Status:  Fetal Heart Rate (bpm): 140/144   Movement: Present    General: Alert, oriented and cooperative. Patient is in no acute distress.  Skin: Skin is warm and dry. No rash noted.   Cardiovascular: Normal heart rate noted  Respiratory: Normal respiratory effort, no problems with respiration noted  Abdomen: Soft, gravid, appropriate for gestational age.  Pain/Pressure: Present     Pelvic: Cervical exam deferred        Extremities: Normal range of motion.  Edema: None  Mental Status: Normal mood and affect. Normal behavior. Normal judgment and thought content.   Assessment and Plan:  Pregnancy: [id number] at [redacted]w[redacted]d 1. Supervision of high risk pregnancy, antepartum (Primary) BP and FHRs normal   2. [redacted] weeks gestation of pregnancy Tdap today Desires RSV, reach out to other clinic to see if they can administer   3. Unwanted fertility Desires BTL, has  not signed consent yet, signed today, discussed will consent with MD  Discussed if delivery prior to 30 days consent might have to have interval tubal   4. History of preterm delivery No s&s  5. Dichorionic diamniotic twin pregnancy, antepartum 12/8 u/s twin A EFW 15%, afi normal, twin b afi normal, EFW 52% Follow up u/s 1/5 Plan antenatal testing starting 1/5  6. BMI 45.0-49.9, adult [facility]) Following MFM    Preterm labor symptoms and general obstetric precautions including but not limited to vaginal bleeding, contractions, leaking of fluid and fetal movement were reviewed in detail with the patient. Please refer to After Visit Summary for other counseling recommendations.   Return in two weeks   Future Appointments  Date Time Provider Department Center  01/19/2024 10:15 AM [facility] PROVIDER 1 [facility]  01/19/2024 10:30 AM [facility] US3 [facility]  01/26/2024 10:15 AM [facility] PROVIDER 1 [facility]  01/26/2024 10:30 AM [facility] US4 [facility]  01/27/2024  2:35 PM Delores Nidia CROME, FNP [facility] [id number]  02/02/2024 10:15 AM [facility] PROVIDER 1 [facility]  02/02/2024 10:30 AM [facility] US4 [facility]  02/09/2024 10:15 AM [facility] PROVIDER 1 [facility]  02/09/2024 10:30 AM [facility] US2 [facility]  02/10/2024  2:35 PM Delores Nidia CROME, FNP [facility] [id number]  02/24/2024  2:35 PM Delores Nidia CROME, FNP [facility] [address]  03/02/2024  2:15 PM Delores Nidia CROME, FNP [facility] [id number]  03/09/2024  2:15 PM Delores Nidia CROME, FNP [facility] [phone] [address]    Nidia Delores, FNP "

## 2024-01-13 NOTE — Addendum Note (Signed)
 Addended by: DARRYLE PRESIDENT B on: 01/13/2024 05:06 PM   Modules accepted: Orders

## 2024-01-19 ENCOUNTER — Ambulatory Visit

## 2024-01-19 DIAGNOSIS — Z8751 Personal history of pre-term labor: Secondary | ICD-10-CM

## 2024-01-19 DIAGNOSIS — O99210 Obesity complicating pregnancy, unspecified trimester: Secondary | ICD-10-CM

## 2024-01-19 DIAGNOSIS — O30049 Twin pregnancy, dichorionic/diamniotic, unspecified trimester: Secondary | ICD-10-CM

## 2024-01-26 ENCOUNTER — Ambulatory Visit: Attending: Obstetrics | Admitting: Obstetrics and Gynecology

## 2024-01-26 ENCOUNTER — Other Ambulatory Visit: Payer: Self-pay | Admitting: Obstetrics

## 2024-01-26 ENCOUNTER — Ambulatory Visit

## 2024-01-26 VITALS — BP 114/52 | HR 87

## 2024-01-26 DIAGNOSIS — O30043 Twin pregnancy, dichorionic/diamniotic, third trimester: Secondary | ICD-10-CM | POA: Insufficient documentation

## 2024-01-26 DIAGNOSIS — O99210 Obesity complicating pregnancy, unspecified trimester: Secondary | ICD-10-CM

## 2024-01-26 DIAGNOSIS — Z3A34 34 weeks gestation of pregnancy: Secondary | ICD-10-CM | POA: Insufficient documentation

## 2024-01-26 DIAGNOSIS — O30049 Twin pregnancy, dichorionic/diamniotic, unspecified trimester: Secondary | ICD-10-CM

## 2024-01-26 DIAGNOSIS — Z8751 Personal history of pre-term labor: Secondary | ICD-10-CM

## 2024-01-26 DIAGNOSIS — Z362 Encounter for other antenatal screening follow-up: Secondary | ICD-10-CM | POA: Diagnosis not present

## 2024-01-26 DIAGNOSIS — E669 Obesity, unspecified: Secondary | ICD-10-CM | POA: Diagnosis not present

## 2024-01-26 DIAGNOSIS — O0990 Supervision of high risk pregnancy, unspecified, unspecified trimester: Secondary | ICD-10-CM

## 2024-01-26 DIAGNOSIS — O99213 Obesity complicating pregnancy, third trimester: Secondary | ICD-10-CM | POA: Diagnosis not present

## 2024-01-26 DIAGNOSIS — O09213 Supervision of pregnancy with history of pre-term labor, third trimester: Secondary | ICD-10-CM | POA: Insufficient documentation

## 2024-01-26 NOTE — Progress Notes (Signed)
 After review, MFM consult with provider is not indicated for today  Arna Ranks, MD 01/26/2024 11:09 AM  [facility]

## 2024-01-27 ENCOUNTER — Ambulatory Visit (INDEPENDENT_AMBULATORY_CARE_PROVIDER_SITE_OTHER): Payer: Self-pay | Admitting: Obstetrics and Gynecology

## 2024-01-27 VITALS — BP 110/54 | HR 76 | Wt 302.8 lb

## 2024-01-27 DIAGNOSIS — O99213 Obesity complicating pregnancy, third trimester: Secondary | ICD-10-CM | POA: Diagnosis not present

## 2024-01-27 DIAGNOSIS — O09213 Supervision of pregnancy with history of pre-term labor, third trimester: Secondary | ICD-10-CM | POA: Diagnosis not present

## 2024-01-27 DIAGNOSIS — Z6841 Body Mass Index (BMI) 40.0 and over, adult: Secondary | ICD-10-CM

## 2024-01-27 DIAGNOSIS — Z3A34 34 weeks gestation of pregnancy: Secondary | ICD-10-CM

## 2024-01-27 DIAGNOSIS — O30049 Twin pregnancy, dichorionic/diamniotic, unspecified trimester: Secondary | ICD-10-CM

## 2024-01-27 DIAGNOSIS — O30043 Twin pregnancy, dichorionic/diamniotic, third trimester: Secondary | ICD-10-CM | POA: Diagnosis not present

## 2024-01-27 DIAGNOSIS — Z8751 Personal history of pre-term labor: Secondary | ICD-10-CM

## 2024-01-27 DIAGNOSIS — O0990 Supervision of high risk pregnancy, unspecified, unspecified trimester: Secondary | ICD-10-CM

## 2024-01-27 NOTE — Progress Notes (Signed)
" ° °  PRENATAL VISIT NOTE  Subjective:  Alexandra Raymond is a 34 y.o. [phone] at [redacted]w[redacted]d being seen today for ongoing prenatal care.  She is currently monitored for the following issues for this high-risk pregnancy and has PCOS (polycystic ovarian syndrome); History of preterm delivery; BMI 45.0-49.9, adult (HCC); Supervision of high risk pregnancy, antepartum; Maternal morbid obesity, antepartum (HCC); Dichorionic diamniotic twin pregnancy, antepartum; and Unwanted fertility on their problem list.  Patient reports no complaints.  Contractions: Not present. Vag. Bleeding: None.  Movement: Present. Denies leaking of fluid.   The following portions of the patient's history were reviewed and updated as appropriate: allergies, current medications, past family history, past medical history, past social history, past surgical history and problem list.   Objective:   Vitals:   01/27/24 1437  BP: (!) 110/54  Pulse: 76  Weight: (!) 302 lb 12.8 oz (137.3 kg)    Fetal Status:  Fetal Heart Rate (bpm): 145/150   Movement: Present    General: Alert, oriented and cooperative. Patient is in no acute distress.  Skin: Skin is warm and dry. No rash noted.   Cardiovascular: Normal heart rate noted  Respiratory: Normal respiratory effort, no problems with respiration noted  Abdomen: Soft, gravid, appropriate for gestational age.  Pain/Pressure: Present (Pressure)     Pelvic: Cervical exam deferred        Extremities: Normal range of motion.  Edema: Trace (Feet)  Mental Status: Normal mood and affect. Normal behavior. Normal judgment and thought content.   Assessment and Plan:  Pregnancy: [id number] at [redacted]w[redacted]d 1. Supervision of high risk pregnancy, antepartum (Primary)   2. [redacted] weeks gestation of pregnancy Anticipatory guidance regarding swabs next visit  Will send message to [facility] to see if they can do RSV on ultrasound day   3. Dichorionic diamniotic twin pregnancy, antepartum 1/12 u/s A bpp 8/8 EFW 29%,  Twin B EFW 46% BPP 8/8 Cephalic/cephalic Deliver 37 weeks d/t Di/di and persistent right umbilical vein baby A, IOL scheduled today  Continue weekly BPP   4. History of preterm delivery No s&s  5. BMI 45.0-49.9, adult [facility]) Following with MFM   Preterm labor symptoms and general obstetric precautions including but not limited to vaginal bleeding, contractions, leaking of fluid and fetal movement were reviewed in detail with the patient. Please refer to After Visit Summary for other counseling recommendations.     Future Appointments  Date Time Provider Department Center  02/03/2024  3:15 PM [facility] PROVIDER 1 [facility]  02/03/2024  3:30 PM [facility] US3 [facility]  02/09/2024  3:15 PM [facility] PROVIDER 1 [facility]  02/09/2024  3:30 PM [facility] US5 [facility]  02/10/2024  2:35 PM Delores Nidia CROME, FNP [facility] [id number]  02/24/2024  2:35 PM Delores Nidia CROME, FNP [facility] [address]  03/02/2024  2:15 PM Delores Nidia CROME, FNP [facility] [address]  03/09/2024  2:15 PM Delores Nidia CROME, FNP [facility] [phone] [address]    Nidia Delores, FNP "

## 2024-02-02 ENCOUNTER — Other Ambulatory Visit

## 2024-02-03 ENCOUNTER — Ambulatory Visit: Attending: Obstetrics | Admitting: Obstetrics

## 2024-02-03 ENCOUNTER — Telehealth: Payer: Self-pay

## 2024-02-03 ENCOUNTER — Ambulatory Visit

## 2024-02-03 VITALS — BP 124/74 | HR 105

## 2024-02-03 DIAGNOSIS — O99213 Obesity complicating pregnancy, third trimester: Secondary | ICD-10-CM | POA: Insufficient documentation

## 2024-02-03 DIAGNOSIS — Z8751 Personal history of pre-term labor: Secondary | ICD-10-CM

## 2024-02-03 DIAGNOSIS — O30043 Twin pregnancy, dichorionic/diamniotic, third trimester: Secondary | ICD-10-CM | POA: Diagnosis not present

## 2024-02-03 DIAGNOSIS — Z3A35 35 weeks gestation of pregnancy: Secondary | ICD-10-CM | POA: Insufficient documentation

## 2024-02-03 DIAGNOSIS — Z362 Encounter for other antenatal screening follow-up: Secondary | ICD-10-CM | POA: Diagnosis not present

## 2024-02-03 DIAGNOSIS — O99210 Obesity complicating pregnancy, unspecified trimester: Secondary | ICD-10-CM

## 2024-02-03 DIAGNOSIS — O0990 Supervision of high risk pregnancy, unspecified, unspecified trimester: Secondary | ICD-10-CM

## 2024-02-03 DIAGNOSIS — O09213 Supervision of pregnancy with history of pre-term labor, third trimester: Secondary | ICD-10-CM | POA: Diagnosis not present

## 2024-02-03 DIAGNOSIS — E669 Obesity, unspecified: Secondary | ICD-10-CM | POA: Diagnosis not present

## 2024-02-03 DIAGNOSIS — O30049 Twin pregnancy, dichorionic/diamniotic, unspecified trimester: Secondary | ICD-10-CM

## 2024-02-03 NOTE — Progress Notes (Signed)
 MFM Consult Note  Alexandra Raymond is currently at [redacted]w[redacted]d. She has been followed due to a dichorionic, diamniotic twin gestation and maternal obesity with a BMI of 47.7.   She denies any problems since her last exam and reports feeling fetal movements of both fetuses throughout the day.    Her blood pressure today was 124/74.  Sonographic findings Dichorionic, diamniotic twin gestation at 35 weeks and 2 days. Fetal cardiac activity: A: Observed, B: Observed. Presentation: A: Cephalic, B: Cephalic. Amniotic fluid: A: Within normal limits, MVP 4.85 cm.   B: Within normal limits, MVP 4.16 cm.  Placenta: A: Posterior Right, B: Anterior. BPP:   A: 8/8  B: 8/8  Due to the dichorionic twin gestation and maternal obesity, we will continue to follow her with weekly fetal testing until delivery.    She already has an induction of labor scheduled on February 16, 2024.    She will return in 1 week for another BPP.  The patient stated that all of her questions were answered.   A total of 20 minutes was spent counseling and coordinating the care for this patient.  Greater than 50% of the time was spent in direct face-to-face contact.

## 2024-02-03 NOTE — Telephone Encounter (Signed)
 Attempted to call patient in regarding of wanting RSV vaccine during pregnancy. Informed patient to call us  back regarding RN visit for vaccine today.  Rosaline, RN

## 2024-02-06 ENCOUNTER — Other Ambulatory Visit: Payer: Self-pay

## 2024-02-06 ENCOUNTER — Ambulatory Visit: Payer: Self-pay

## 2024-02-06 VITALS — BP 122/85 | HR 84 | Ht 64.0 in | Wt 300.5 lb

## 2024-02-06 DIAGNOSIS — O0990 Supervision of high risk pregnancy, unspecified, unspecified trimester: Secondary | ICD-10-CM

## 2024-02-06 DIAGNOSIS — Z2911 Encounter for prophylactic immunotherapy for respiratory syncytial virus (RSV): Secondary | ICD-10-CM | POA: Diagnosis not present

## 2024-02-06 DIAGNOSIS — Z3A35 35 weeks gestation of pregnancy: Secondary | ICD-10-CM

## 2024-02-06 NOTE — Progress Notes (Signed)
 Alexandra Raymond is here to receive the RSV vaccine. Patient is [redacted]w[redacted]d, denies vaginal bleeding, fluid leaking and pain. Vaccine administered without complication.   BP-122/85 Di/Di Twins FHR- 145/154  Next OB as scheduled 02/12/24. Reviewed MAU precautions; patient voices understanding. Patient states no further questions or concerns.   Devon, RN 02/06/24

## 2024-02-09 ENCOUNTER — Ambulatory Visit

## 2024-02-09 ENCOUNTER — Ambulatory Visit: Attending: Obstetrics

## 2024-02-10 ENCOUNTER — Encounter (HOSPITAL_COMMUNITY): Payer: Self-pay | Admitting: *Deleted

## 2024-02-10 ENCOUNTER — Encounter (HOSPITAL_BASED_OUTPATIENT_CLINIC_OR_DEPARTMENT_OTHER): Payer: Self-pay | Admitting: Obstetrics and Gynecology

## 2024-02-10 ENCOUNTER — Telehealth (HOSPITAL_COMMUNITY): Payer: Self-pay | Admitting: *Deleted

## 2024-02-10 NOTE — Telephone Encounter (Signed)
 Preadmission screen

## 2024-02-12 ENCOUNTER — Ambulatory Visit (HOSPITAL_BASED_OUTPATIENT_CLINIC_OR_DEPARTMENT_OTHER): Admitting: Obstetrics and Gynecology

## 2024-02-12 ENCOUNTER — Other Ambulatory Visit (HOSPITAL_COMMUNITY)
Admission: RE | Admit: 2024-02-12 | Discharge: 2024-02-12 | Disposition: A | Source: Ambulatory Visit | Attending: Obstetrics and Gynecology | Admitting: Obstetrics and Gynecology

## 2024-02-12 VITALS — BP 127/75 | HR 91 | Wt 309.6 lb

## 2024-02-12 DIAGNOSIS — O30049 Twin pregnancy, dichorionic/diamniotic, unspecified trimester: Secondary | ICD-10-CM

## 2024-02-12 DIAGNOSIS — Z6841 Body Mass Index (BMI) 40.0 and over, adult: Secondary | ICD-10-CM

## 2024-02-12 DIAGNOSIS — O09893 Supervision of other high risk pregnancies, third trimester: Secondary | ICD-10-CM

## 2024-02-12 DIAGNOSIS — O0990 Supervision of high risk pregnancy, unspecified, unspecified trimester: Secondary | ICD-10-CM

## 2024-02-12 DIAGNOSIS — Z1332 Encounter for screening for maternal depression: Secondary | ICD-10-CM | POA: Diagnosis not present

## 2024-02-12 DIAGNOSIS — Z3A36 36 weeks gestation of pregnancy: Secondary | ICD-10-CM

## 2024-02-12 DIAGNOSIS — O30043 Twin pregnancy, dichorionic/diamniotic, third trimester: Secondary | ICD-10-CM

## 2024-02-12 LAB — OB RESULTS CONSOLE GBS: GBS: NEGATIVE

## 2024-02-12 NOTE — Progress Notes (Signed)
 "  PRENATAL VISIT NOTE  Subjective:  Alexandra Raymond is a 34 y.o. [phone] at [redacted]w[redacted]d being seen today for ongoing prenatal care.  She is currently monitored for the following issues for this high-risk pregnancy and has PCOS (polycystic ovarian syndrome); History of preterm delivery; BMI 45.0-49.9, adult (HCC); Supervision of high risk pregnancy, antepartum; Maternal morbid obesity, antepartum (HCC); Dichorionic diamniotic twin pregnancy, antepartum; and Unwanted fertility on their problem list.  Patient reports intermittent cramping.  Contractions: Irregular. Vag. Bleeding: None.  Movement: Present. Denies leaking of fluid.   The following portions of the patient's history were reviewed and updated as appropriate: allergies, current medications, past family history, past medical history, past social history, past surgical history and problem list.   Objective:   Vitals:   02/12/24 1126 02/12/24 1203  BP: (!) 140/80 127/75  Pulse: (!) 101 91  Weight: (!) 309 lb 9.6 oz (140.4 kg)     Fetal Status:  Fetal Heart Rate (bpm): 150/150   Movement: Present Presentation: Vertex  General: Alert, oriented and cooperative. Patient is in no acute distress.  Skin: Skin is warm and dry. No rash noted.   Cardiovascular: Normal heart rate noted  Respiratory: Normal respiratory effort, no problems with respiration noted  Abdomen: Soft, gravid, appropriate for gestational age.  Pain/Pressure: Present     Pelvic: Cervical exam performed in the presence of a chaperone Dilation: 3 Effacement (%): 50 Station: -3  Extremities: Normal range of motion.  Edema: Trace (Feet and Legs)  Mental Status: Normal mood and affect. Normal behavior. Normal judgment and thought content.      02/12/2024   12:18 PM 09/05/2023   10:01 AM 01/12/2020    2:32 PM  Depression screen PHQ 2/9  Decreased Interest 1 0 0  Down, Depressed, Hopeless 0 0 0  PHQ - 2 Score 1 0 0  Altered sleeping 0 0 0  Tired, decreased energy 1 0 0   Change in appetite 0 0 0  Feeling bad or failure about yourself  0 0 0  Trouble concentrating 0 0 0  Moving slowly or fidgety/restless 0 0 0  Suicidal thoughts 0 0 0  PHQ-9 Score 2 0  0   Difficult doing work/chores Not difficult at all       Data saved with a previous flowsheet row definition        02/12/2024   12:18 PM 09/05/2023   10:01 AM 12/24/2019    9:44 AM  GAD 7 : Generalized Anxiety Score  Nervous, Anxious, on Edge 1 1  0   Control/stop worrying 1 0  0   Worry too much - different things 1 0  0   Trouble relaxing 0 0  0   Restless 0 0  0   Easily annoyed or irritable 0 0  0   Afraid - awful might happen 0 0  0   Total GAD 7 Score 3 1 0  Anxiety Difficulty Not difficult at all       Data saved with a previous flowsheet row definition   NST twin A : 150s, moderate variability, accelerations present, decelerations absent, reactive  Twin B: 145s, moderate variability, accelerations present, decelerations absent, reactive  Assessment and Plan:  Pregnancy: [id number] at [redacted]w[redacted]d 1. Supervision of high risk pregnancy, antepartum (Primary) BP and FHR normal     2. [redacted] weeks gestation of pregnancy Swabs today Labor precautions discussed   - Cervicovaginal ancillary only( [place]) - Culture, beta strep (group b  only)  3. Dichorionic diamniotic twin pregnancy, antepartum 1/20  twin a  cephalic 8/8 Twin b cephalic 8/8  IOL 2/2 Reactive NST today   4. BMI 45.0-49.9, adult (HCC)   Preterm labor symptoms and general obstetric precautions including but not limited to vaginal bleeding, contractions, leaking of fluid and fetal movement were reviewed in detail with the patient. Please refer to After Visit Summary for other counseling recommendations.   Return postpartum   Future Appointments  Date Time Provider Department Center  02/16/2024  7:00 AM [facility] SCHED ROOM [facility] None    Nidia Daring, FNP "

## 2024-02-13 LAB — CERVICOVAGINAL ANCILLARY ONLY
Chlamydia: NEGATIVE
Comment: NEGATIVE
Comment: NEGATIVE
Comment: NORMAL
Neisseria Gonorrhea: NEGATIVE
Trichomonas: NEGATIVE

## 2024-02-15 ENCOUNTER — Ambulatory Visit: Payer: Self-pay | Admitting: Obstetrics and Gynecology

## 2024-02-16 ENCOUNTER — Inpatient Hospital Stay (HOSPITAL_COMMUNITY)
Admission: AD | Admit: 2024-02-16 | Discharge: 2024-02-18 | DRG: 807 | Disposition: A | Attending: Family Medicine | Admitting: Family Medicine

## 2024-02-16 ENCOUNTER — Encounter (HOSPITAL_COMMUNITY): Payer: Self-pay | Admitting: Obstetrics and Gynecology

## 2024-02-16 ENCOUNTER — Inpatient Hospital Stay (HOSPITAL_COMMUNITY)

## 2024-02-16 DIAGNOSIS — O30043 Twin pregnancy, dichorionic/diamniotic, third trimester: Principal | ICD-10-CM | POA: Diagnosis present

## 2024-02-16 DIAGNOSIS — Z833 Family history of diabetes mellitus: Secondary | ICD-10-CM

## 2024-02-16 DIAGNOSIS — O99214 Obesity complicating childbirth: Secondary | ICD-10-CM | POA: Diagnosis present

## 2024-02-16 DIAGNOSIS — Z888 Allergy status to other drugs, medicaments and biological substances status: Secondary | ICD-10-CM

## 2024-02-16 DIAGNOSIS — O99210 Obesity complicating pregnancy, unspecified trimester: Secondary | ICD-10-CM | POA: Diagnosis present

## 2024-02-16 DIAGNOSIS — O0990 Supervision of high risk pregnancy, unspecified, unspecified trimester: Secondary | ICD-10-CM

## 2024-02-16 DIAGNOSIS — O30049 Twin pregnancy, dichorionic/diamniotic, unspecified trimester: Principal | ICD-10-CM | POA: Diagnosis present

## 2024-02-16 DIAGNOSIS — Z7982 Long term (current) use of aspirin: Secondary | ICD-10-CM

## 2024-02-16 DIAGNOSIS — Z3A37 37 weeks gestation of pregnancy: Secondary | ICD-10-CM

## 2024-02-16 DIAGNOSIS — Z3009 Encounter for other general counseling and advice on contraception: Secondary | ICD-10-CM | POA: Diagnosis present

## 2024-02-16 DIAGNOSIS — Z302 Encounter for sterilization: Secondary | ICD-10-CM

## 2024-02-16 LAB — CBC
HCT: 33.6 % — ABNORMAL LOW (ref 36.0–46.0)
Hemoglobin: 10.8 g/dL — ABNORMAL LOW (ref 12.0–15.0)
MCH: 27.8 pg (ref 26.0–34.0)
MCHC: 32.1 g/dL (ref 30.0–36.0)
MCV: 86.4 fL (ref 80.0–100.0)
Platelets: 208 10*3/uL (ref 150–400)
RBC: 3.89 MIL/uL (ref 3.87–5.11)
RDW: 14.8 % (ref 11.5–15.5)
WBC: 8.7 10*3/uL (ref 4.0–10.5)
nRBC: 0 % (ref 0.0–0.2)

## 2024-02-16 LAB — TYPE AND SCREEN
ABO/RH(D): A POS
Antibody Screen: NEGATIVE

## 2024-02-16 LAB — SYPHILIS: RPR W/REFLEX TO RPR TITER AND TREPONEMAL ANTIBODIES, TRADITIONAL SCREENING AND DIAGNOSIS ALGORITHM: RPR Ser Ql: NONREACTIVE

## 2024-02-16 MED ORDER — IBUPROFEN 600 MG PO TABS
600.0000 mg | ORAL_TABLET | Freq: Four times a day (QID) | ORAL | Status: DC
  Administered 2024-02-16 – 2024-02-18 (×7): 600 mg via ORAL
  Filled 2024-02-16 (×7): qty 1

## 2024-02-16 MED ORDER — SENNOSIDES-DOCUSATE SODIUM 8.6-50 MG PO TABS
2.0000 | ORAL_TABLET | Freq: Every day | ORAL | Status: DC
  Administered 2024-02-17 – 2024-02-18 (×2): 2 via ORAL
  Filled 2024-02-16 (×2): qty 2

## 2024-02-16 MED ORDER — OXYTOCIN-SODIUM CHLORIDE 30-0.9 UT/500ML-% IV SOLN
2.5000 [IU]/h | INTRAVENOUS | Status: DC
  Administered 2024-02-16: 2.5 [IU]/h via INTRAVENOUS

## 2024-02-16 MED ORDER — TRANEXAMIC ACID-NACL 1000-0.7 MG/100ML-% IV SOLN
INTRAVENOUS | Status: AC
  Filled 2024-02-16: qty 100

## 2024-02-16 MED ORDER — LACTATED RINGERS IV SOLN: INTRAVENOUS | Status: DC

## 2024-02-16 MED ORDER — PRENATAL MULTIVITAMIN CH
1.0000 | ORAL_TABLET | Freq: Every day | ORAL | Status: DC
  Administered 2024-02-17 – 2024-02-18 (×2): 1 via ORAL
  Filled 2024-02-16 (×2): qty 1

## 2024-02-16 MED ORDER — ACETAMINOPHEN 325 MG PO TABS: 650.0000 mg | ORAL_TABLET | ORAL | Status: DC | PRN

## 2024-02-16 MED ORDER — LACTATED RINGERS IV SOLN: 500.0000 mL | INTRAVENOUS | Status: DC | PRN

## 2024-02-16 MED ORDER — TERBUTALINE SULFATE 1 MG/ML IJ SOLN: 0.2500 mg | Freq: Once | INTRAMUSCULAR | Status: DC | PRN

## 2024-02-16 MED ORDER — ONDANSETRON HCL 4 MG PO TABS: 4.0000 mg | ORAL_TABLET | ORAL | Status: DC | PRN

## 2024-02-16 MED ORDER — OXYTOCIN BOLUS FROM INFUSION
333.0000 mL | Freq: Once | INTRAVENOUS | Status: AC
  Administered 2024-02-16: 333 mL via INTRAVENOUS

## 2024-02-16 MED ORDER — DIPHENHYDRAMINE HCL 25 MG PO CAPS: 25.0000 mg | ORAL_CAPSULE | Freq: Four times a day (QID) | ORAL | Status: DC | PRN

## 2024-02-16 MED ORDER — COCONUT OIL OIL: 1.0000 | TOPICAL_OIL | Status: DC | PRN

## 2024-02-16 MED ORDER — FENTANYL CITRATE (PF) 100 MCG/2ML IJ SOLN: 100.0000 ug | INTRAMUSCULAR | Status: DC | PRN

## 2024-02-16 MED ORDER — ONDANSETRON HCL 4 MG/2ML IJ SOLN: 4.0000 mg | INTRAMUSCULAR | Status: DC | PRN

## 2024-02-16 MED ORDER — TETANUS-DIPHTH-ACELL PERTUSSIS 5-2-15.5 LF-MCG/0.5 IM SUSP: 0.5000 mL | Freq: Once | INTRAMUSCULAR | Status: DC

## 2024-02-16 MED ORDER — LIDOCAINE HCL (PF) 1 % IJ SOLN: 30.0000 mL | INTRAMUSCULAR | Status: DC | PRN

## 2024-02-16 MED ORDER — BENZOCAINE-MENTHOL 20-0.5 % EX AERO
1.0000 | INHALATION_SPRAY | CUTANEOUS | Status: DC | PRN
  Administered 2024-02-17: 1 via TOPICAL
  Filled 2024-02-16: qty 56

## 2024-02-16 MED ORDER — TRANEXAMIC ACID-NACL 1000-0.7 MG/100ML-% IV SOLN
1000.0000 mg | INTRAVENOUS | Status: AC
  Administered 2024-02-16: 1000 mg via INTRAVENOUS

## 2024-02-16 MED ORDER — ONDANSETRON HCL 4 MG/2ML IJ SOLN: 4.0000 mg | Freq: Four times a day (QID) | INTRAMUSCULAR | Status: DC | PRN

## 2024-02-16 MED ORDER — ZOLPIDEM TARTRATE 5 MG PO TABS: 5.0000 mg | ORAL_TABLET | Freq: Every evening | ORAL | Status: DC | PRN

## 2024-02-16 MED ORDER — DIBUCAINE (PERIANAL) 1 % EX OINT: 1.0000 | TOPICAL_OINTMENT | CUTANEOUS | Status: DC | PRN

## 2024-02-16 MED ORDER — ERYTHROMYCIN 5 MG/GM OP OINT
TOPICAL_OINTMENT | OPHTHALMIC | Status: AC
  Filled 2024-02-16: qty 1

## 2024-02-16 MED ORDER — SIMETHICONE 80 MG PO CHEW: 80.0000 mg | CHEWABLE_TABLET | ORAL | Status: DC | PRN

## 2024-02-16 MED ORDER — WITCH HAZEL-GLYCERIN EX PADS: 1.0000 | MEDICATED_PAD | CUTANEOUS | Status: DC | PRN

## 2024-02-16 MED ORDER — OXYTOCIN-SODIUM CHLORIDE 30-0.9 UT/500ML-% IV SOLN
1.0000 m[IU]/min | INTRAVENOUS | Status: DC
  Administered 2024-02-16: 2 m[IU]/min via INTRAVENOUS
  Filled 2024-02-16: qty 500

## 2024-02-16 MED ORDER — SOD CITRATE-CITRIC ACID 500-334 MG/5ML PO SOLN: 30.0000 mL | ORAL | Status: DC | PRN

## 2024-02-16 NOTE — Plan of Care (Signed)

## 2024-02-17 ENCOUNTER — Encounter (HOSPITAL_COMMUNITY): Payer: Self-pay | Admitting: Anesthesiology

## 2024-02-17 ENCOUNTER — Encounter (HOSPITAL_COMMUNITY): Admission: AD | Disposition: A | Payer: Self-pay | Source: Home / Self Care | Attending: Family Medicine

## 2024-02-17 LAB — CBC
HCT: 29.5 % — ABNORMAL LOW (ref 36.0–46.0)
Hemoglobin: 9.6 g/dL — ABNORMAL LOW (ref 12.0–15.0)
MCH: 28.1 pg (ref 26.0–34.0)
MCHC: 32.5 g/dL (ref 30.0–36.0)
MCV: 86.3 fL (ref 80.0–100.0)
Platelets: 185 10*3/uL (ref 150–400)
RBC: 3.42 MIL/uL — ABNORMAL LOW (ref 3.87–5.11)
RDW: 14.9 % (ref 11.5–15.5)
WBC: 12.3 10*3/uL — ABNORMAL HIGH (ref 4.0–10.5)
nRBC: 0 % (ref 0.0–0.2)

## 2024-02-17 LAB — CULTURE, BETA STREP (GROUP B ONLY): Strep Gp B Culture: NEGATIVE

## 2024-02-17 MED ORDER — METOCLOPRAMIDE HCL 10 MG PO TABS: 10.0000 mg | ORAL_TABLET | Freq: Once | ORAL | Status: DC

## 2024-02-17 MED ORDER — FAMOTIDINE 20 MG PO TABS: 40.0000 mg | ORAL_TABLET | Freq: Once | ORAL | Status: DC

## 2024-02-17 MED ORDER — LACTATED RINGERS IV SOLN: INTRAVENOUS | Status: DC

## 2024-02-17 NOTE — Progress Notes (Signed)
 POSTPARTUM PROGRESS NOTE Postpartum Day 1  Subjective: Jacob Chamblee is a 34 y.o. [id number] s/p NVD twins at [redacted]w[redacted]d.  She reports she is doing well. No acute events overnight. She denies any problems with ambulating, voiding or po intake. Denies nausea or vomiting.  Pain is mostly cramping.  Lochia is appropriate.  Objective: Blood pressure 109/66, pulse 78, temperature 98.1 F (36.7 C), temperature source Oral, resp. rate 18, height 5' 4 (1.626 m), weight (!) 141.7 kg, last menstrual period 06/01/2023, SpO2 99%, unknown if currently breastfeeding.  Physical Exam:  General: alert, cooperative and no distress Heart:regular rate Resp: nonlabored Uterine Fundus: firm, appropriately tender, F>U and moderately deep Extremities:  Trace edema Skin: warm, dry  Recent Labs    02/16/24 1209  HGB 10.8*  HCT 33.6*    Assessment/Plan: Jovie Swanner is a 34 y.o. [id number] s/p NVDat [redacted]w[redacted]d   PPD#1 - meeting milestones - continue routine postpartum care - po iron q2d   Undesired fertility Medicaid form signed 12/30. Prefer postpartum rather than interval - if interval, bridge contraception is TBD depending on date  Feeding: formula Contraception: BTS   Dispo: Plan for discharge PPD2.  LOS: 1 day   Barabara Maier, DO [facility] [employer]

## 2024-02-17 NOTE — Progress Notes (Signed)
 Per Hurr MD , Dr Zina has the patient's thirty day papers.

## 2024-02-17 NOTE — Progress Notes (Signed)
 Spoke with patient regarding tubal ligation.  Able to palpate the uterus but did not feel she is the best candidate for postpartum tubal and discussed that she might better be served by interval tubal.  Patient in agreements.  Will send message to [facility] to have her scheduled for preop visit.

## 2024-02-18 ENCOUNTER — Other Ambulatory Visit (HOSPITAL_COMMUNITY): Payer: Self-pay

## 2024-02-18 LAB — SURGICAL PATHOLOGY

## 2024-02-18 MED ORDER — IBUPROFEN 600 MG PO TABS
600.0000 mg | ORAL_TABLET | Freq: Four times a day (QID) | ORAL | 0 refills | Status: AC
  Filled 2024-02-18: qty 30, 8d supply, fill #0

## 2024-02-18 MED ORDER — ACETAMINOPHEN 500 MG PO TABS
1000.0000 mg | ORAL_TABLET | Freq: Four times a day (QID) | ORAL | 2 refills | Status: AC | PRN
  Filled 2024-02-18: qty 100, 13d supply, fill #0

## 2024-02-24 ENCOUNTER — Encounter (HOSPITAL_BASED_OUTPATIENT_CLINIC_OR_DEPARTMENT_OTHER): Payer: Self-pay | Admitting: Obstetrics and Gynecology

## 2024-03-02 ENCOUNTER — Encounter (HOSPITAL_BASED_OUTPATIENT_CLINIC_OR_DEPARTMENT_OTHER): Payer: Self-pay | Admitting: Obstetrics and Gynecology

## 2024-03-09 ENCOUNTER — Encounter (HOSPITAL_BASED_OUTPATIENT_CLINIC_OR_DEPARTMENT_OTHER): Payer: Self-pay | Admitting: Obstetrics and Gynecology

## 2024-03-30 ENCOUNTER — Ambulatory Visit (HOSPITAL_BASED_OUTPATIENT_CLINIC_OR_DEPARTMENT_OTHER): Payer: Self-pay | Admitting: Obstetrics & Gynecology
